# Patient Record
Sex: Male | Born: 1955
Health system: Southern US, Community
[De-identification: ages and names within clinical notes are randomized; demographics above are authoritative.]

## PROBLEM LIST (undated history)

## (undated) DIAGNOSIS — M545 Low back pain, unspecified: Secondary | ICD-10-CM

## (undated) DIAGNOSIS — R112 Nausea with vomiting, unspecified: Secondary | ICD-10-CM

## (undated) DIAGNOSIS — Z9889 Other specified postprocedural states: Secondary | ICD-10-CM

## (undated) DIAGNOSIS — C801 Malignant (primary) neoplasm, unspecified: Secondary | ICD-10-CM

## (undated) DIAGNOSIS — D494 Neoplasm of unspecified behavior of bladder: Secondary | ICD-10-CM

## (undated) HISTORY — DX: Malignant (primary) neoplasm, unspecified: C80.1

---

## 1999-02-11 HISTORY — PX: MENISCUS REPAIR: SHX5179

## 2000-02-11 DIAGNOSIS — D494 Neoplasm of unspecified behavior of bladder: Secondary | ICD-10-CM

## 2000-02-11 HISTORY — DX: Neoplasm of unspecified behavior of bladder: D49.4

## 2000-02-11 HISTORY — PX: CYSTOSCOPY: SHX5120

## 2000-08-12 ENCOUNTER — Encounter (INDEPENDENT_AMBULATORY_CARE_PROVIDER_SITE_OTHER): Payer: Self-pay | Admitting: Specialist

## 2000-08-12 ENCOUNTER — Ambulatory Visit (HOSPITAL_COMMUNITY): Admission: RE | Admit: 2000-08-12 | Discharge: 2000-08-12 | Payer: Self-pay | Admitting: Urology

## 2007-12-29 ENCOUNTER — Encounter: Payer: Self-pay | Admitting: Gastroenterology

## 2008-01-10 ENCOUNTER — Encounter: Payer: Self-pay | Admitting: Gastroenterology

## 2008-02-02 DIAGNOSIS — C679 Malignant neoplasm of bladder, unspecified: Secondary | ICD-10-CM | POA: Insufficient documentation

## 2008-02-03 ENCOUNTER — Ambulatory Visit: Payer: Self-pay | Admitting: Gastroenterology

## 2008-02-03 DIAGNOSIS — R1310 Dysphagia, unspecified: Secondary | ICD-10-CM | POA: Insufficient documentation

## 2008-02-11 HISTORY — PX: INGUINAL HERNIA REPAIR: SHX194

## 2008-02-25 ENCOUNTER — Encounter: Payer: Self-pay | Admitting: Gastroenterology

## 2008-02-25 ENCOUNTER — Ambulatory Visit: Payer: Self-pay | Admitting: Gastroenterology

## 2008-02-29 ENCOUNTER — Encounter: Payer: Self-pay | Admitting: Gastroenterology

## 2008-05-23 ENCOUNTER — Ambulatory Visit (HOSPITAL_COMMUNITY): Admission: RE | Admit: 2008-05-23 | Discharge: 2008-05-23 | Payer: Self-pay | Admitting: Surgery

## 2008-05-23 ENCOUNTER — Encounter (INDEPENDENT_AMBULATORY_CARE_PROVIDER_SITE_OTHER): Payer: Self-pay | Admitting: Surgery

## 2010-01-13 IMAGING — CR DG CHEST 2V
2 series · 2 of 2 positions shown · non-contrast
Comparison: None

CLINICAL DATA: Preop for left inguinal hernia repair

CHEST - 2 VIEW

[view not recorded (1 of 2)]
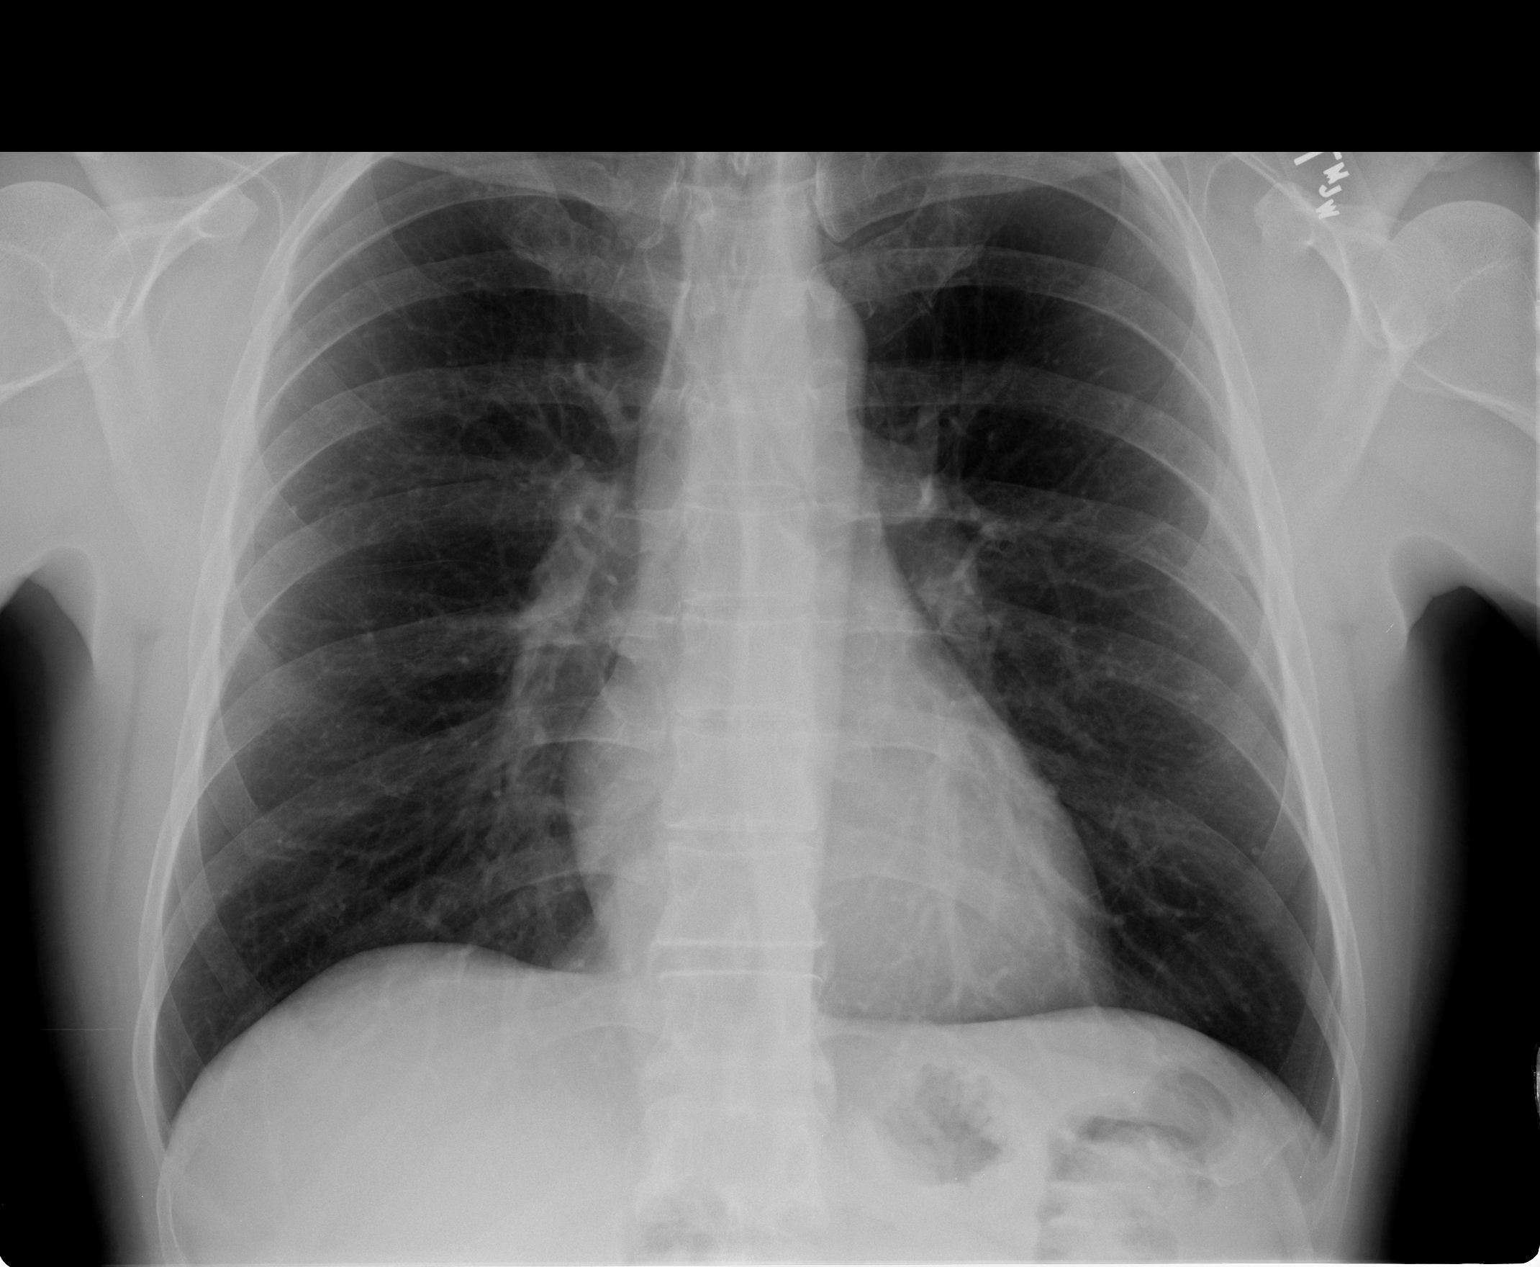

[view not recorded (2 of 2)]
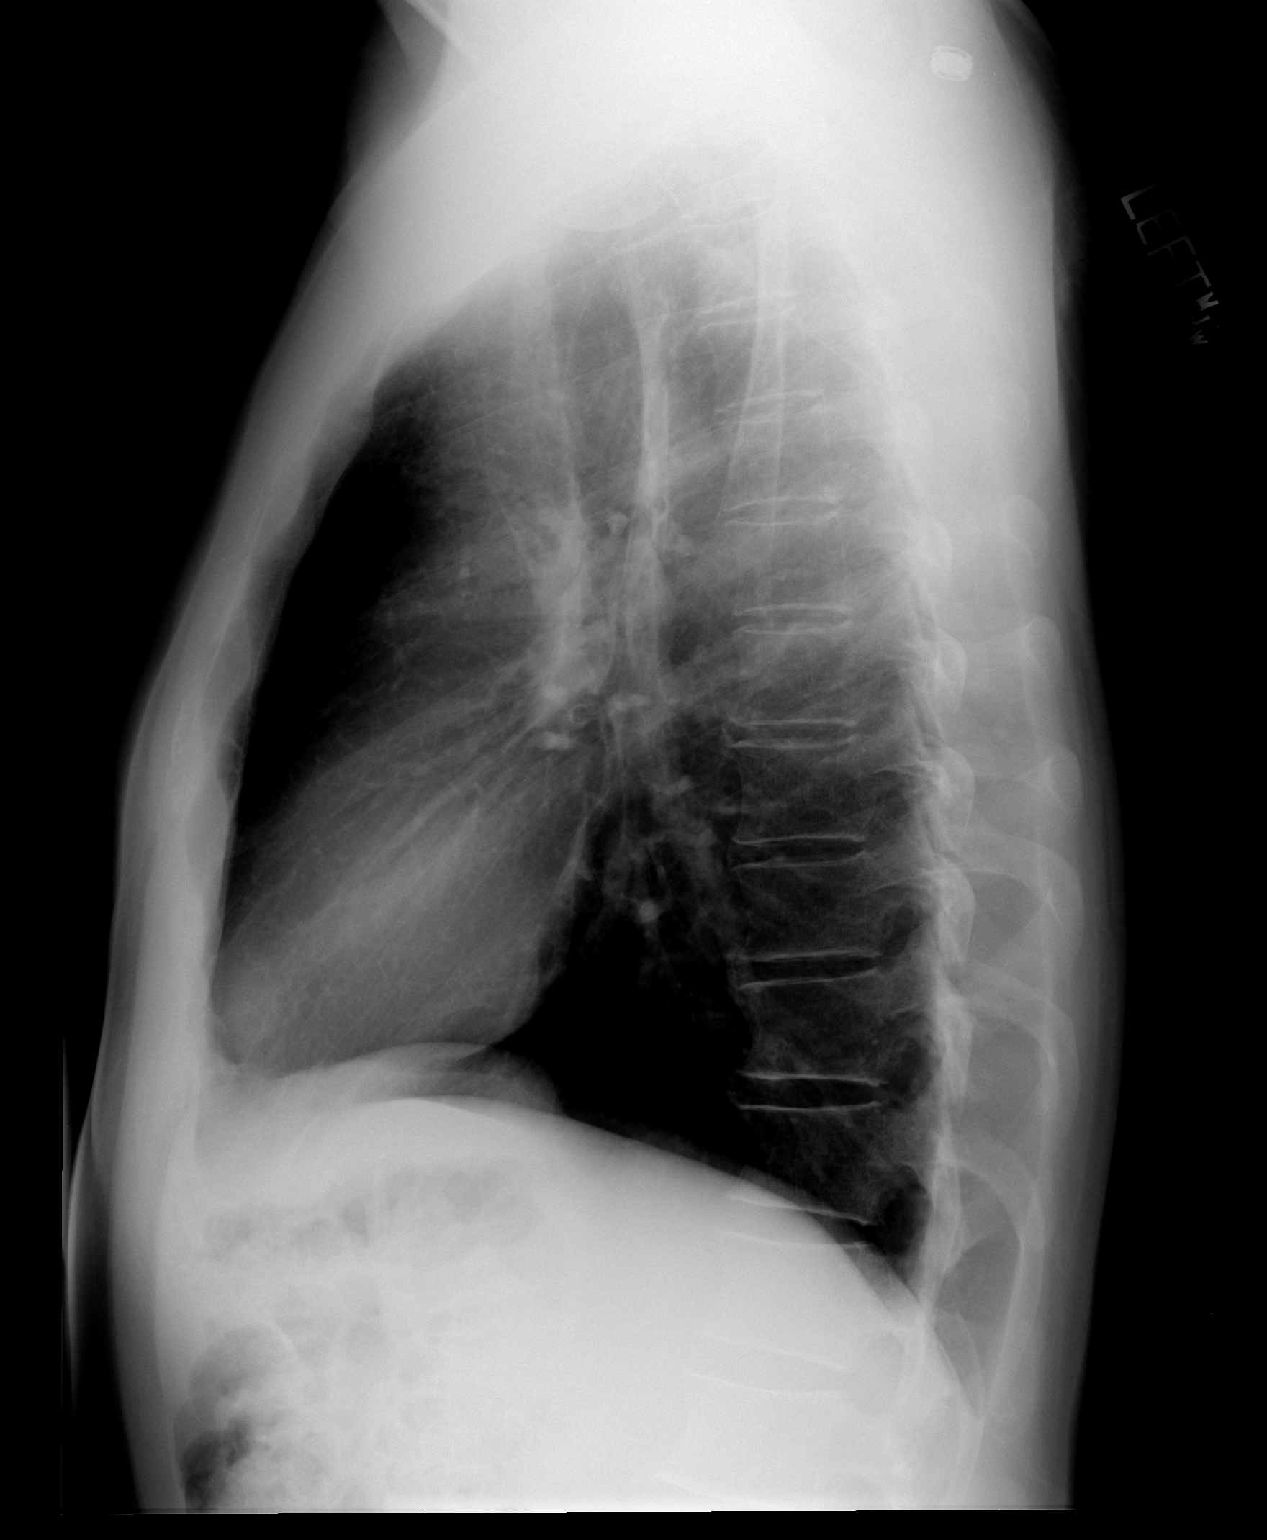

[2 of 2 positions shown; findings below may reference images not displayed]

FINDINGS: The lungs are clear. The heart is within normal limits in
size. No bony abnormality is seen.
IMPRESSION: No active lung disease.

## 2010-05-22 LAB — CBC
HCT: 46.4 % (ref 39.0–52.0)
Hemoglobin: 16.1 g/dL (ref 13.0–17.0)
MCHC: 34.8 g/dL (ref 30.0–36.0)
RBC: 5.15 MIL/uL (ref 4.22–5.81)
RDW: 12.7 % (ref 11.5–15.5)

## 2010-05-22 LAB — BASIC METABOLIC PANEL
CO2: 26 mEq/L (ref 19–32)
Glucose, Bld: 80 mg/dL (ref 70–99)
Potassium: 4.5 mEq/L (ref 3.5–5.1)
Sodium: 138 mEq/L (ref 135–145)

## 2010-05-22 LAB — DIFFERENTIAL
Basophils Absolute: 0 10*3/uL (ref 0.0–0.1)
Basophils Relative: 0 % (ref 0–1)
Eosinophils Relative: 3 % (ref 0–5)
Lymphocytes Relative: 30 % (ref 12–46)
Monocytes Absolute: 0.5 10*3/uL (ref 0.1–1.0)
Monocytes Relative: 9 % (ref 3–12)

## 2010-06-25 NOTE — Op Note (Signed)
NAMEDEQUAVION, FOLLETTE              ACCOUNT NO.:  [id number]   MEDICAL RECORD NO.:  [id number]          PATIENT TYPE:  AMB   LOCATION:  SDS                          FACILITY:  [facility]   PHYSICIAN:  Thomas A. Cornett, M.D.DATE OF BIRTH:  Jun 30, 1955   DATE OF PROCEDURE:  05/23/2008  DATE OF DISCHARGE:                               OPERATIVE REPORT   PREOPERATIVE DIAGNOSES:  1. A 4 cm x 4 cm left chest wall mass.  2. Left inguinal hernia.   DISCHARGE DIAGNOSES:  1. A 4 cm x 4 cm left chest wall mass.  2. Left inguinal hernia.   PROCEDURES PERFORMED:  1. Left inguinal hernia repair.  2. Excision of left chest wall mass subfascial, measuring 4 x 4 cm.   ANESTHESIA:  LMA with 0.25% Sensorcaine local.   ESTIMATED BLOOD LOSS:  20 mL.   DRAINS:  None.   SPECIMEN:  Left chest wall mass 4 x 4 cm, appears to be a lipoma to  Pathology.   INDICATIONS FOR PROCEDURE:  The patient is a 55 year old male with a  left inguinal hernia and a mass just below his left breast.  He wished  to have this resected because it was getting larger and wished to have  his hernia repaired due to pain.  He presents today for the above.   DESCRIPTION OF PROCEDURE:  The patient was placed supine on the OR  table.  After induction of general anesthesia, the left lower chest and  left inguinal regions were prepped in one field under sterile conditions  and draped.  The mass was addressed first at the chest wall.  This area  was marked preoperatively.  Incision was made over it.  Dissection was  carried down.  We had gotten into the fascia, we had opened the fascia  of the pectoralis major muscle when this large fibrofatty tissue was  located. This appeared to be a lipoma.  This was excised in its entirety  and came out quite easily.  We closed the fascia after excision after  ensuring hemostasis with 3-0 Vicryl.  A 4-0 Monocryl was used to close  the skin in subcuticular fashion.   Left inguinal hernia was  addressed next.  An oblique incision was made  in the left inguinal crease.  We used 0.25% Sensorcaine local  anesthesia.  Dissection was carried down through Scarpa fascia until we  identified the aponeurosis of the external oblique.  This was opened in  the direction of its fibers after infiltrating the aponeurosis with  0.25% Sensorcaine local.  Cord structures were identified and encircled  with a 1/4-inch  Penrose drain.  There was a large direct hernia and I  was able to dissect this away from cord.  I then inspected the cord  structures and found no evidence of an indirect hernia or a lipoma.  I  reopened floor of the inguinal canal to better define the defect.  I  placed my finger into preperitoneal space and swept around to create  space for mesh.  I then used UltraPro mesh and constructed an inner  leaflet  and then an onlay piece using a #1 Novafil.  The onlay piece was  placed in the preperitoneal space, I used my finger to spread this out.  I then laid the onlay piece down to the floor of the inguinal canal.  I  secured it down to the pubic tubercle with a 0 Vicryl and I secured the  place where the 2 meshes were combined to the internal oblique with a 0  Vicryl.  We used 0 Novafil to secure the mesh circumferentially to the  shelving edge of inguinal ligament and to the internal oblique and  conjoined tendon medially.  The ilioinguinal nerve was tethered and it  was meshed and I elected to divide this, so this would not be caught in  the mesh and cause postoperative pain, which seemed to do no matter how  I positioned it.  Also, branch of the iliohypogastric had been divided  as well for similar reason because it was tethered to the mesh and I was  concerned for postoperative neuralgic pain secondary to irritation of  the mesh.  We cut a slit for the cord structures and then secured the  mesh around the cord structures with 0 Novafil.  There was ample space  with the tip of my  fifth digit to fit here without any tension.  The  mesh was laid nicely and covered the defect quite nicely.  Irrigation  was used and suctioned out.  I closed the fascia of the external oblique  with a running 2-0 Vicryl.  A 3-0 Vicryl was then used to close Scarpa  fascia and 4-0 Monocryl was used to close the skin in a subcuticular  fashion.  Dermabond was applied to both incisions.  All final counts  were counted and found to be correct of sponge, instruments, and  needles.  The patient was then awoke after Dermabond was applied to his  left chest wall incision in satisfactory condition.       Thomas A. Cornett, M.D.  Electronically Signed     TAC/MEDQ  D:  05/23/2008  T:  05/24/2008  Job:  [id number]   cc:   Jonita Albee, M.D.

## 2010-06-28 NOTE — Op Note (Signed)
[facility]  Patient:    Michael Cooke, Michael Cooke                     MRN: [id number] Proc. Date: 08/12/00 Adm. Date:  [id number] Attending:  Lauree Chandler                           Operative Report  PREOPERATIVE DIAGNOSIS:  Bladder carcinoma.  POSTOPERATIVE DIAGNOSIS:  Bladder carcinoma.  PROCEDURE:  Cystoscopy, bilateral retrograde pyelograms with interpretation, transurethral resection of bladder tumor (3 cm) and left ureteroscopy.  SURGEON:  Dr. Larey Dresser.  ANESTHESIA:  General.  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 On July 30, 2000, renal ultrasound revealed no abnormalities. Cystoscopy revealed a classic papillary tumor on the left trigone right next to the left ureteral orifice. He is brought to the OR today for further evaluation and treatment.  DESCRIPTION OF PROCEDURE:  The patient was brought to the operating room and placed in lithotomy position. The external genitalia were prepped and draped in the usual fashion. He was cystoscoped and the only lesion in the bladder was a 3 cm papillary bladder tumor with a narrow stalk right next to the left ureteral orifice and in fact it was just over the course of the intramural ureter. Bilateral retrograde pyelograms were obtained and there were no evidence of filling defects or obstruction to either upper tract. I did leave an open ended catheter up the left ureter and at this point removed the cystoscope and sounded from 24 to 30 [place] and placed in the 28 [place] resectoscope sheath and with the pure cutting current, resected the stalk at the base and removed the tumor all in one piece. The base of the tumor was then fulgurated, there was no evidence of any residual tumor whatsoever. I then removed the resectoscope and put in a guidewire and removed the open ended ureteral catheter  and inserted the 6 French ultra thin ureteroscope and looked up the very distal ureter and there was no evidence of intraureteral tumor which is fortunate. The guidewire and ureteroscope were removed and the bladder rescoped and there was just some tiny inflammation at the bladder neck from passage of instruments but no significant bleeding. The bladder was emptied, scope removed and the patient sent to the recovery room in good condition. DD:  08/12/00 TD:  08/12/00 Job: [id number]

## 2012-12-24 ENCOUNTER — Encounter: Payer: Self-pay | Admitting: Gastroenterology

## 2013-07-12 ENCOUNTER — Encounter: Payer: Self-pay | Admitting: Gastroenterology

## 2013-08-05 ENCOUNTER — Observation Stay (HOSPITAL_COMMUNITY)
Admission: EM | Admit: 2013-08-05 | Discharge: 2013-08-06 | Disposition: A | Discharge status: Home / Self Care | Payer: BC Managed Care – PPO | Attending: Internal Medicine | Admitting: Internal Medicine

## 2013-08-05 ENCOUNTER — Emergency Department (HOSPITAL_COMMUNITY): Payer: BC Managed Care – PPO

## 2013-08-05 ENCOUNTER — Encounter (HOSPITAL_COMMUNITY): Payer: Self-pay | Admitting: Emergency Medicine

## 2013-08-05 DIAGNOSIS — M545 Low back pain, unspecified: Secondary | ICD-10-CM

## 2013-08-05 DIAGNOSIS — M549 Dorsalgia, unspecified: Secondary | ICD-10-CM | POA: Diagnosis present

## 2013-08-05 DIAGNOSIS — R1310 Dysphagia, unspecified: Secondary | ICD-10-CM

## 2013-08-05 DIAGNOSIS — Z791 Long term (current) use of non-steroidal anti-inflammatories (NSAID): Secondary | ICD-10-CM | POA: Insufficient documentation

## 2013-08-05 DIAGNOSIS — D72829 Elevated white blood cell count, unspecified: Secondary | ICD-10-CM

## 2013-08-05 DIAGNOSIS — M5380 Other specified dorsopathies, site unspecified: Secondary | ICD-10-CM | POA: Insufficient documentation

## 2013-08-05 DIAGNOSIS — Z79899 Other long term (current) drug therapy: Secondary | ICD-10-CM | POA: Insufficient documentation

## 2013-08-05 HISTORY — DX: Neoplasm of unspecified behavior of bladder: D49.4

## 2013-08-05 HISTORY — DX: Low back pain, unspecified: M54.50

## 2013-08-05 HISTORY — DX: Other specified postprocedural states: R11.2

## 2013-08-05 HISTORY — DX: Nausea with vomiting, unspecified: Z98.890

## 2013-08-05 HISTORY — DX: Low back pain: M54.5

## 2013-08-05 LAB — CBC WITH DIFFERENTIAL/PLATELET
Basophils Absolute: 0 10*3/uL (ref 0.0–0.1)
Basophils Relative: 0 % (ref 0–1)
Eosinophils Absolute: 0 10*3/uL (ref 0.0–0.7)
Eosinophils Relative: 0 % (ref 0–5)
HCT: 45.9 % (ref 39.0–52.0)
HEMOGLOBIN: 16.7 g/dL (ref 13.0–17.0)
LYMPHS ABS: 1.2 10*3/uL (ref 0.7–4.0)
Lymphocytes Relative: 8 % — ABNORMAL LOW (ref 12–46)
MCH: 31.4 pg (ref 26.0–34.0)
MCHC: 36.4 g/dL — ABNORMAL HIGH (ref 30.0–36.0)
MCV: 86.3 fL (ref 78.0–100.0)
MONOS PCT: 5 % (ref 3–12)
Monocytes Absolute: 0.7 10*3/uL (ref 0.1–1.0)
NEUTROS ABS: 13 10*3/uL — AB (ref 1.7–7.7)
NEUTROS PCT: 87 % — AB (ref 43–77)
Platelets: 177 10*3/uL (ref 150–400)
RBC: 5.32 MIL/uL (ref 4.22–5.81)
RDW: 12.4 % (ref 11.5–15.5)
WBC: 15 10*3/uL — ABNORMAL HIGH (ref 4.0–10.5)

## 2013-08-05 LAB — I-STAT CHEM 8, ED
BUN: 15 mg/dL (ref 6–23)
Calcium, Ion: 1.21 mmol/L (ref 1.12–1.23)
Chloride: 102 meq/L (ref 96–112)
Creatinine, Ser: 0.8 mg/dL (ref 0.50–1.35)
Glucose, Bld: 109 mg/dL — ABNORMAL HIGH (ref 70–99)
HCT: 50 % (ref 39.0–52.0)
Hemoglobin: 17 g/dL (ref 13.0–17.0)
Potassium: 3.6 meq/L — ABNORMAL LOW (ref 3.7–5.3)
Sodium: 144 meq/L (ref 137–147)
TCO2: 24 mmol/L (ref 0–100)

## 2013-08-05 MED ORDER — BIOTENE DRY MOUTH MT LIQD
15.0000 mL | Freq: Two times a day (BID) | OROMUCOSAL | Status: DC
  Administered 2013-08-06: 15 mL via OROMUCOSAL

## 2013-08-05 MED ORDER — SODIUM CHLORIDE 0.9 % IV SOLN
250.0000 mL | INTRAVENOUS | Status: DC | PRN
Start: 2013-08-05 — End: 2013-08-06

## 2013-08-05 MED ORDER — MORPHINE SULFATE 4 MG/ML IJ SOLN
4.0000 mg | Freq: Once | INTRAMUSCULAR | Status: AC
  Administered 2013-08-05: 4 mg via INTRAMUSCULAR
  Filled 2013-08-05: qty 1

## 2013-08-05 MED ORDER — KETOROLAC TROMETHAMINE 30 MG/ML IJ SOLN
30.0000 mg | Freq: Once | INTRAMUSCULAR | Status: AC
  Administered 2013-08-05: 30 mg via INTRAVENOUS
  Filled 2013-08-05: qty 1

## 2013-08-05 MED ORDER — CHLORHEXIDINE GLUCONATE 0.12 % MT SOLN
15.0000 mL | Freq: Two times a day (BID) | OROMUCOSAL | Status: DC
  Administered 2013-08-05 – 2013-08-06 (×2): 15 mL via OROMUCOSAL
  Filled 2013-08-05 (×4): qty 15

## 2013-08-05 MED ORDER — OXYCODONE HCL 5 MG PO TABS
5.0000 mg | ORAL_TABLET | ORAL | Status: DC | PRN
  Administered 2013-08-05 – 2013-08-06 (×2): 5 mg via ORAL
  Filled 2013-08-05 (×2): qty 1

## 2013-08-05 MED ORDER — SODIUM CHLORIDE 0.9 % IJ SOLN
3.0000 mL | Freq: Two times a day (BID) | INTRAMUSCULAR | Status: DC
  Administered 2013-08-05 – 2013-08-06 (×2): 3 mL via INTRAVENOUS

## 2013-08-05 MED ORDER — METOCLOPRAMIDE HCL 5 MG/ML IJ SOLN
10.0000 mg | Freq: Once | INTRAMUSCULAR | Status: AC
  Administered 2013-08-05: 10 mg via INTRAVENOUS
  Filled 2013-08-05: qty 2

## 2013-08-05 MED ORDER — SODIUM CHLORIDE 0.9 % IJ SOLN
3.0000 mL | INTRAMUSCULAR | Status: DC | PRN
Start: 2013-08-05 — End: 2013-08-06

## 2013-08-05 MED ORDER — ONDANSETRON HCL 4 MG/2ML IJ SOLN
4.0000 mg | Freq: Once | INTRAMUSCULAR | Status: AC
  Administered 2013-08-05: 4 mg via INTRAVENOUS
  Filled 2013-08-05: qty 2

## 2013-08-05 MED ORDER — ENOXAPARIN SODIUM 40 MG/0.4ML ~~LOC~~ SOLN
40.0000 mg | Freq: Every day | SUBCUTANEOUS | Status: DC
  Administered 2013-08-05: 40 mg via SUBCUTANEOUS
  Filled 2013-08-05 (×2): qty 0.4

## 2013-08-05 MED ORDER — METHOCARBAMOL 500 MG PO TABS
750.0000 mg | ORAL_TABLET | Freq: Three times a day (TID) | ORAL | Status: DC | PRN
  Administered 2013-08-05 – 2013-08-06 (×2): 750 mg via ORAL
  Filled 2013-08-05 (×2): qty 2

## 2013-08-05 MED ORDER — ACETAMINOPHEN 325 MG PO TABS: 650.0000 mg | ORAL_TABLET | Freq: Four times a day (QID) | ORAL | Status: DC | PRN

## 2013-08-05 MED ORDER — METHOCARBAMOL 500 MG PO TABS: 1000.0000 mg | ORAL_TABLET | Freq: Four times a day (QID) | ORAL | Status: DC | PRN

## 2013-08-05 MED ORDER — SODIUM CHLORIDE 0.9 % IV SOLN: 250.0000 mL | INTRAVENOUS | Status: DC | PRN

## 2013-08-05 MED ORDER — ONDANSETRON HCL 4 MG PO TABS
4.0000 mg | ORAL_TABLET | Freq: Four times a day (QID) | ORAL | Status: DC | PRN
  Administered 2013-08-06: 4 mg via ORAL
  Filled 2013-08-05: qty 1

## 2013-08-05 MED ORDER — SODIUM CHLORIDE 0.9 % IJ SOLN: 3.0000 mL | Freq: Two times a day (BID) | INTRAMUSCULAR | Status: DC

## 2013-08-05 MED ORDER — NAPROXEN 250 MG PO TABS: 250.0000 mg | ORAL_TABLET | Freq: Two times a day (BID) | ORAL | Status: DC

## 2013-08-05 MED ORDER — HYDROMORPHONE HCL PF 1 MG/ML IJ SOLN: 0.5000 mg | INTRAMUSCULAR | Status: DC | PRN

## 2013-08-05 MED ORDER — DIAZEPAM 5 MG PO TABS
5.0000 mg | ORAL_TABLET | Freq: Once | ORAL | Status: AC
  Administered 2013-08-05: 5 mg via ORAL
  Filled 2013-08-05: qty 1

## 2013-08-05 MED ORDER — ACETAMINOPHEN 650 MG RE SUPP: 650.0000 mg | Freq: Four times a day (QID) | RECTAL | Status: DC | PRN

## 2013-08-05 MED ORDER — ALUM & MAG HYDROXIDE-SIMETH 200-200-20 MG/5ML PO SUSP: 30.0000 mL | Freq: Four times a day (QID) | ORAL | Status: DC | PRN

## 2013-08-05 MED ORDER — OXYCODONE-ACETAMINOPHEN 5-325 MG PO TABS: ORAL_TABLET | ORAL | Status: DC

## 2013-08-05 MED ORDER — SODIUM CHLORIDE 0.9 % IJ SOLN: 3.0000 mL | INTRAMUSCULAR | Status: DC | PRN

## 2013-08-05 MED ORDER — ONDANSETRON HCL 4 MG/2ML IJ SOLN: 4.0000 mg | Freq: Four times a day (QID) | INTRAMUSCULAR | Status: DC | PRN

## 2013-08-05 MED ORDER — HYDROMORPHONE HCL PF 1 MG/ML IJ SOLN: 1.0000 mg | INTRAMUSCULAR | Status: AC | PRN

## 2013-08-05 MED ORDER — HYDROMORPHONE HCL PF 1 MG/ML IJ SOLN
1.0000 mg | Freq: Once | INTRAMUSCULAR | Status: AC
  Administered 2013-08-05: 1 mg via INTRAMUSCULAR
  Filled 2013-08-05: qty 1

## 2013-08-05 MED ORDER — ONDANSETRON HCL 4 MG/2ML IJ SOLN: 4.0000 mg | Freq: Three times a day (TID) | INTRAMUSCULAR | Status: AC | PRN

## 2013-08-05 NOTE — ED Notes (Signed)
Initial Contact - pt laying flat on stretcher, family at bedside.  Pt reports hx back spasms, usually one episode per year, last episode x1 week ago.  Pt reports unable to get self off floor this AM with spasm.  Pt denies pain at this time, reports symp have moderately resolved since arrival to ED.  Pt able to move extremities independently, however does have pain with movement.  Pt denies n/t to extremities, denies bowel/bladder changes or complaints.  Skin PWD.  NAD.

## 2013-08-05 NOTE — ED Notes (Signed)
Per EMS-was bending over to open file cabinet when he injured back-states he is having a "spasm"-took 3 ibuprofen, 2 tylenol, and 1 flexeril at 1025

## 2013-08-05 NOTE — ED Notes (Signed)
Attempted to d/c pt. Again. Pt actively vomiting. MD made aware and will come to see pt.

## 2013-08-05 NOTE — ED Notes (Signed)
Pt to radiology.

## 2013-08-05 NOTE — ED Provider Notes (Addendum)
Michael Cooke Patient could not get up to walk for discharge. After treatment in the emergency department. He continues to complain of back pain and has begun to vomit since treatment here. Toradol ordered. At 19 10 PM patient is able to stand with assistance however he again began to vomit upon standing and complains of spasming in his lower back, nonradiating.. Patient is in too much discomfort to go home. Spoke with Dr. Arnoldo Morale. Patient to be placed on medical surgical floor, 23 hour observation, intractable back pain Results for orders placed during the hospital encounter of 08/05/13  CBC WITH DIFFERENTIAL      Result Value Ref Range   WBC 15.0 (*) 4.0 - 10.5 K/uL   RBC 5.32  4.22 - 5.81 MIL/uL   Hemoglobin 16.7  13.0 - 17.0 g/dL   HCT 45.9  39.0 - 52.0 %   MCV 86.3  78.0 - 100.0 fL   MCH 31.4  26.0 - 34.0 pg   MCHC 36.4 (*) 30.0 - 36.0 g/dL   RDW 12.4  11.5 - 15.5 %   Platelets 177  150 - 400 K/uL   Neutrophils Relative % 87 (*) 43 - 77 %   Neutro Abs 13.0 (*) 1.7 - 7.7 K/uL   Lymphocytes Relative 8 (*) 12 - 46 %   Lymphs Abs 1.2  0.7 - 4.0 K/uL   Monocytes Relative 5  3 - 12 %   Monocytes Absolute 0.7  0.1 - 1.0 K/uL   Eosinophils Relative 0  0 - 5 %   Eosinophils Absolute 0.0  0.0 - 0.7 K/uL   Basophils Relative 0  0 - 1 %   Basophils Absolute 0.0  0.0 - 0.1 K/uL  I-STAT CHEM 8, ED      Result Value Ref Range   Sodium 144  137 - 147 mEq/L   Potassium 3.6 (*) 3.7 - 5.3 mEq/L   Chloride 102  96 - 112 mEq/L   BUN 15  6 - 23 mg/dL   Creatinine, Ser 0.80  0.50 - 1.35 mg/dL   Glucose, Bld 109 (*) 70 - 99 mg/dL   Calcium, Ion 1.21  1.12 - 1.23 mmol/L   TCO2 24  0 - 100 mmol/L   Hemoglobin 17.0  13.0 - 17.0 g/dL   HCT 50.0  39.0 - 52.0 %   Dg Lumbar Spine Complete  08/05/2013   CLINICAL DATA:  Back pain  EXAM: LUMBAR SPINE - COMPLETE 4+ VIEW  COMPARISON:  None.  FINDINGS: Five lumbar type vertebral bodies show minimal curvature in the frontal projection and normal alignment in the lateral  projection. Disc heights are normal except for mild narrowing at L1-2 with small anterior osteophytes. No evidence of pars defect. There is minimal facet degeneration at L4-5 and L5-S1.  IMPRESSION: Minimal curvature. Minimal lower lumbar facet degeneration. No advanced finding.   Electronically Signed   By: Nelson Chimes M.D.   On: 08/05/2013 14:03    Orlie Dakin, MD 08/05/13 2101  Orlie Dakin, MD 08/05/13 2104

## 2013-08-05 NOTE — ED Notes (Signed)
Rn went to d/c pt, Pt sts he is unable to move or sit up to leave. MD made aware. 1mg  of dilaudid given.

## 2013-08-05 NOTE — ED Notes (Signed)
Report given to RN, no bed in room at this time. Floor RN will call when bed is placed.

## 2013-08-05 NOTE — ED Provider Notes (Signed)
CSN: [id number]     Arrival date & time 08/05/13  1219 History   First MD Initiated Contact with Patient 08/05/13 1255     Chief Complaint  Patient presents with  . Back Injury      HPI Pt was seen at 1310.  Per pt, c/o gradual onset and persistence of constant acute flair of his chronic low back "pain" for the past 1 to 2 weeks, worse over the past 2 days.  Denies any change in his usual chronic pain pattern.  Pain worsens with palpation of the area and body position changes. Pt states he was "leaning over a filing cabinet" when he developed a "spasm" in his right lower back. Pt states he "swam over a mile in the pool" this morning that "made me feel better" but "then I got worse again." Pt's wife states pt "might have done too much too soon." Pt states he took tylenol, motrin, and flexeril at approximately 1000 PTA.   Denies incont/retention of bowel or bladder, no saddle anesthesia, no focal motor weakness, no tingling/numbness in extremities, no fevers, no direct injury, no abd pain.   The symptoms have been associated with no other complaints.     Past Medical History  Diagnosis Date  . Bladder tumor 2002    removed by cystoscopy  . Low back pain    Past Surgical History  Procedure Laterality Date  . Cystoscopy  2002    for bladder tumor  . Inguinal hernia repair  2010    History  Substance Use Topics  . Smoking status: Never Smoker   . Smokeless tobacco: Not on file  . Alcohol Use: No    Review of Systems ROS: Statement: All systems negative except as marked or noted in the HPI; Constitutional: Negative for fever and chills. ; ; Eyes: Negative for eye pain, redness and discharge. ; ; ENMT: Negative for ear pain, hoarseness, nasal congestion, sinus pressure and sore throat. ; ; Cardiovascular: Negative for chest pain, palpitations, diaphoresis, dyspnea and peripheral edema. ; ; Respiratory: Negative for cough, wheezing and stridor. ; ; Gastrointestinal: Negative for nausea,  vomiting, diarrhea, abdominal pain, blood in stool, hematemesis, jaundice and rectal bleeding. . ; ; Genitourinary: Negative for dysuria, flank pain and hematuria. ; ; Musculoskeletal: +LBP. Negative for neck pain. Negative for swelling and trauma.; ; Skin: Negative for pruritus, rash, abrasions, blisters, bruising and skin lesion.; ; Neuro: Negative for headache, lightheadedness and neck stiffness. Negative for weakness, altered level of consciousness , altered mental status, extremity weakness, paresthesias, involuntary movement, seizure and syncope.      Allergies  Lactose intolerance (gi)  Home Medications   Prior to Admission medications   Medication Sig Start Date End Date Taking? Authorizing Provider  acetaminophen (TYLENOL) 500 MG tablet Take 500 mg by mouth every 6 (six) hours as needed.   Yes Historical Provider, MD  cyclobenzaprine (FLEXERIL) 10 MG tablet Take 10 mg by mouth once.   Yes Historical Provider, MD  ibuprofen (ADVIL,MOTRIN) 200 MG tablet Take 600 mg by mouth every 6 (six) hours as needed for moderate pain.   Yes Historical Provider, MD  methocarbamol (ROBAXIN) 500 MG tablet Take 2 tablets (1,000 mg total) by mouth 4 (four) times daily as needed for muscle spasms (muscle spasm/pain). 08/05/13   Alfonzo Feller, DO  naproxen (NAPROSYN) 250 MG tablet Take 1 tablet (250 mg total) by mouth 2 (two) times daily with a meal. 08/05/13   Alfonzo Feller, DO  oxyCODONE-acetaminophen (  PERCOCET/ROXICET) 5-325 MG per tablet 1 or 2 tabs PO q6h prn pain 08/05/13   Alfonzo Feller, DO   BP 128/81  Pulse 56  Temp(Src) 97.8 F (36.6 C) (Oral)  Resp 18  SpO2 100% Physical Exam 1315: Physical examination:  Nursing notes reviewed; Vital signs and O2 SAT reviewed;  Constitutional: Well developed, Well nourished, Well hydrated, In no acute distress; Head:  Normocephalic, atraumatic; Eyes: EOMI, PERRL, No scleral icterus; ENMT: Mouth and pharynx normal, Mucous membranes moist; Neck:  Supple, Full range of motion, No lymphadenopathy; Cardiovascular: Regular rate and rhythm, No gallop; Respiratory: Breath sounds clear & equal bilaterally, No rales, rhonchi, wheezes.  Speaking full sentences with ease, Normal respiratory effort/excursion; Chest: Nontender, Movement normal; Abdomen: Soft, Nontender, Nondistended, Normal bowel sounds; Genitourinary: No CVA tenderness; Spine:  No midline CS, TS, LS tenderness. +mild TTP right lumbar paraspinal muscles.;; Extremities: Pulses normal, No tenderness, No edema, No calf edema or asymmetry.; Neuro: AA&Ox3, Major CN grossly intact.  Speech clear. Strength 5/5 equal bilat UE's and LE's, including great toe dorsiflexion.  DTR 2/4 equal bilat UE's and LE's.  No gross sensory deficits.  Neg straight leg raises bilat.; Skin: Color normal, Warm, Dry.   ED Course  Procedures     MDM  MDM Reviewed: previous chart, nursing note and vitals Interpretation: x-ray    Dg Lumbar Spine Complete 08/05/2013   CLINICAL DATA:  Back pain  EXAM: LUMBAR SPINE - COMPLETE 4+ VIEW  COMPARISON:  None.  FINDINGS: Five lumbar type vertebral bodies show minimal curvature in the frontal projection and normal alignment in the lateral projection. Disc heights are normal except for mild narrowing at L1-2 with small anterior osteophytes. No evidence of pars defect. There is minimal facet degeneration at L4-5 and L5-S1.  IMPRESSION: Minimal curvature. Minimal lower lumbar facet degeneration. No advanced finding.   Electronically Signed   By: Nelson Chimes M.D.   On: 08/05/2013 14:03    1525:  "Back spasms" improved after meds. States he "feels the spasm now just when I move a certain way." Will remedicate. Pt wants to go home now. Dx and testing d/w pt and family.  Questions answered.  Verb understanding, agreeable to d/c home with outpt f/u.     Alfonzo Feller, DO 08/06/13 2144

## 2013-08-05 NOTE — ED Notes (Signed)
Bed: WA16 Expected date:  Expected time:  Means of arrival:  Comments: EMS-back pain 

## 2013-08-05 NOTE — ED Notes (Signed)
RN attempted to d/c patient again, pt was standing but began to feel nauseous. Pt given ginger ale and crackers. MD made aware.

## 2013-08-05 NOTE — H&P (Addendum)
[facility] History and Physical  URBAN NAVAL TIR:[id number] DOB: November 29, 1955 DOA: 08/05/2013  Referring physician: EDP PCP: No primary provider on file.  Specialists:   Chief Complaint: Severe Low Back Pain  HPI: Michael Cooke is a 58 y.o. male previously healthy until today when he began to have severe lower right side back pain and muscle spasms.  He reports that his pain began with pain on the left side then he began to have pain on the right side of his lower back.  He describes having 10/10 pain and has not been able to sit up or walk.   He denies any recent trauma and denies any over exertion, and he reports that once a year he has an episode like this with his back for many years.   He denies any numbness or paresthesias or loss of bowel or bladder function.     Review of Systems:  Constitutional: No Weight Loss, No Weight Gain, Night Sweats, Fevers, Chills, Fatigue, or Generalized Weakness HEENT: No Headaches, Difficulty Swallowing,Tooth/Dental Problems,Sore Throat,  No Sneezing, Rhinitis, Ear Ache, Nasal Congestion, or Post Nasal Drip,  Cardio-vascular:  No Chest pain, Orthopnea, PND, Edema in lower extremities, Anasarca, Dizziness, Palpitations  Resp: No Dyspnea, No DOE, No Productive Cough, No Non-Productive Cough, No Hemoptysis, No Change in Color of Mucus,  No Wheezing.    GI: No Heartburn, Indigestion, Abdominal Pain, Nausea, Vomiting, Diarrhea, Change in Bowel Habits,  Loss of Appetite  GU: No Dysuria, Change in Color of Urine, No Urgency or Frequency.  No flank pain.  Musculoskeletal: No Joint Pain or Swelling.  +Decreased Range of Motion in Back. +Back Pain.  Neurologic: No Syncope, No Seizures, Muscle Weakness, Paresthesia, Vision Disturbance or Loss, No Diplopia, No Vertigo, +Difficulty Walking,  Skin: No Rash or Lesions. Psych: No Change in Mood or Affect. No Depression or Anxiety. No Memory loss. No Confusion or Hallucinations   Past Medical History   Diagnosis Date  . Bladder tumor 2002    removed by cystoscopy  . Low back pain   . PONV (postoperative nausea and vomiting)     Past Surgical History  Procedure Laterality Date  . Cystoscopy  2002    for bladder tumor  . Inguinal hernia repair  2010     Prior to Admission medications   Medication Sig Start Date End Date Taking? Authorizing Provider  acetaminophen (TYLENOL) 500 MG tablet Take 500 mg by mouth every 6 (six) hours as needed.   Yes Historical Provider, MD  cyclobenzaprine (FLEXERIL) 10 MG tablet Take 10 mg by mouth once.   Yes Historical Provider, MD  ibuprofen (ADVIL,MOTRIN) 200 MG tablet Take 600 mg by mouth every 6 (six) hours as needed for moderate pain.   Yes Historical Provider, MD  methocarbamol (ROBAXIN) 500 MG tablet Take 2 tablets (1,000 mg total) by mouth 4 (four) times daily as needed for muscle spasms (muscle spasm/pain). 08/05/13   Alfonzo Feller, DO  naproxen (NAPROSYN) 250 MG tablet Take 1 tablet (250 mg total) by mouth 2 (two) times daily with a meal. 08/05/13   Alfonzo Feller, DO  oxyCODONE-acetaminophen (PERCOCET/ROXICET) 5-325 MG per tablet 1 or 2 tabs PO q6h prn pain 08/05/13   Alfonzo Feller, DO     Allergies  Allergen Reactions  . Lactose Intolerance (Gi)     unknown    Social History:  reports that he has never smoked. He has never used smokeless tobacco. He reports that he does not drink  alcohol or use illicit drugs.     Family History  Problem Relation Age of Onset  . Lymphoma Mother     Non Hodgkins Lymphoma      Physical Exam:  GEN:  Pleasant Well Nourished and Well developed 58 y.o. Caucasian male examined and in no acute distress; cooperative with exam Filed Vitals:   08/05/13 1748 08/05/13 1932 08/05/13 2113 08/05/13 2220  BP: 136/81 107/71 129/80 138/71  Pulse: 64 52 61 69  Temp:  97.6 F (36.4 C) 97.4 F (36.3 C) 97.8 F (36.6 C)  TempSrc:  Oral Oral Oral  Resp: 17 16 14 16   Height:    5\' 10"  (1.778 m)   Weight:    77.973 kg (171 lb 14.4 oz)  SpO2: 100% 96% 99% 99%   Blood pressure 138/71, pulse 69, temperature 97.8 F (36.6 C), temperature source Oral, resp. rate 16, height 5\' 10"  (1.778 m), weight 77.973 kg (171 lb 14.4 oz), SpO2 99.00%. PSYCH: He is alert and oriented x4; does not appear anxious does not appear depressed; affect is normal HEENT: Normocephalic and Atraumatic, Mucous membranes pink; PERRLA; EOM intact; Fundi:  Benign;  No scleral icterus, Nares: Patent, Oropharynx: Clear, Fair Dentition, Neck:  FROM, no cervical lymphadenopathy nor thyromegaly or carotid bruit; no JVD; Breasts:: Not examined CHEST WALL: No tenderness CHEST: Normal respiration, clear to auscultation bilaterally HEART: Regular rate and rhythm; no murmurs rubs or gallops BACK: No kyphosis or scoliosis; no CVA tenderness ABDOMEN: Positive Bowel Sounds,  soft non-tender; no masses, no organomegaly Rectal Exam: Not done EXTREMITIES: No cyanosis, clubbing or edema; no ulcerations. Genitalia: not examined PULSES: 2+ and symmetric SKIN: Normal hydration no rash or ulceration CNS:  Alert and Oriented X 4, No Focal Deficits Vascular: pulses palpable throughout    Labs on Admission:  Basic Metabolic Panel:  Recent Labs Lab 08/05/13 1934  NA 144  K 3.6*  CL 102  GLUCOSE 109*  BUN 15  CREATININE 0.80   Liver Function Tests: No results found for this basename: AST, ALT, ALKPHOS, BILITOT, PROT, ALBUMIN,  in the last 168 hours No results found for this basename: LIPASE, AMYLASE,  in the last 168 hours No results found for this basename: AMMONIA,  in the last 168 hours CBC:  Recent Labs Lab 08/05/13 1934 08/05/13 2022  WBC  --  15.0*  NEUTROABS  --  13.0*  HGB 17.0 16.7  HCT 50.0 45.9  MCV  --  86.3  PLT  --  177   Cardiac Enzymes: No results found for this basename: CKTOTAL, CKMB, CKMBINDEX, TROPONINI,  in the last 168 hours  BNP (last 3 results) No results found for this basename: PROBNP,   in the last 8760 hours CBG: No results found for this basename: GLUCAP,  in the last 168 hours  Radiological Exams on Admission: Dg Lumbar Spine Complete  08/05/2013   CLINICAL DATA:  Back pain  EXAM: LUMBAR SPINE - COMPLETE 4+ VIEW  COMPARISON:  None.  FINDINGS: Five lumbar type vertebral bodies show minimal curvature in the frontal projection and normal alignment in the lateral projection. Disc heights are normal except for mild narrowing at L1-2 with small anterior osteophytes. No evidence of pars defect. There is minimal facet degeneration at L4-5 and L5-S1.  IMPRESSION: Minimal curvature. Minimal lower lumbar facet degeneration. No advanced finding.   Electronically Signed   By: Nelson Chimes M.D.   On: 08/05/2013 14:03      Assessment/Plan:   58 y.o. male  with  Principal Problem:   Intractable back pain Active Problems:   Right-sided low back pain without sciatica   Leukocytosis  1.  Intractable Back Pain-  Lumbago with Muscle Spasms,   Pain Control with IV Dilaudid PRN, and Robaxin 750 mg PO q 8 hrs PRN Muscle spasms.  May need a Physical therapy eval.    2. Leukocytosis-  WBC = 15,  Probable Stress Rxn, Monitor Trend.     3.  DVT prophylaxis with Lovenox.       Code Status:     FULL CODE  Family Communication:    Wife at Bedside Disposition Plan:      Observation Status   Time spent: [facility] Pager [phone]  If 7PM-7AM, please contact night-coverage [email or web address] Password [facility] 08/05/2013, 11:17 PM

## 2013-08-06 ENCOUNTER — Observation Stay (HOSPITAL_COMMUNITY): Payer: BC Managed Care – PPO

## 2013-08-06 DIAGNOSIS — M545 Low back pain, unspecified: Principal | ICD-10-CM

## 2013-08-06 DIAGNOSIS — R1310 Dysphagia, unspecified: Secondary | ICD-10-CM

## 2013-08-06 LAB — BASIC METABOLIC PANEL
BUN: 16 mg/dL (ref 6–23)
CO2: 24 mEq/L (ref 19–32)
CREATININE: 0.83 mg/dL (ref 0.50–1.35)
Calcium: 8.7 mg/dL (ref 8.4–10.5)
Chloride: 102 mEq/L (ref 96–112)
GFR calc Af Amer: >90 mL/min (ref >90)
GFR calc non Af Amer: >90 mL/min (ref >90)
GLUCOSE: 108 mg/dL — AB (ref 70–99)
Potassium: 3.7 mEq/L (ref 3.7–5.3)
Sodium: 139 mEq/L (ref 137–147)

## 2013-08-06 LAB — CBC
HEMATOCRIT: 44.3 % (ref 39.0–52.0)
Hemoglobin: 15.1 g/dL (ref 13.0–17.0)
MCH: 29.7 pg (ref 26.0–34.0)
MCHC: 34.1 g/dL (ref 30.0–36.0)
MCV: 87 fL (ref 78.0–100.0)
Platelets: 172 10*3/uL (ref 150–400)
RBC: 5.09 MIL/uL (ref 4.22–5.81)
RDW: 12.5 % (ref 11.5–15.5)
WBC: 7.2 10*3/uL (ref 4.0–10.5)

## 2013-08-06 MED ORDER — GADOBENATE DIMEGLUMINE 529 MG/ML IV SOLN
16.0000 mL | Freq: Once | INTRAVENOUS | Status: AC | PRN
  Administered 2013-08-06: 16 mL via INTRAVENOUS

## 2013-08-06 MED ORDER — NAPROXEN 250 MG PO TABS: 250.0000 mg | ORAL_TABLET | Freq: Two times a day (BID) | ORAL | Status: DC

## 2013-08-06 MED ORDER — OXYCODONE HCL 5 MG PO TABS
5.0000 mg | ORAL_TABLET | ORAL | Status: DC | PRN
  Administered 2013-08-06 (×2): 5 mg via ORAL
  Filled 2013-08-06 (×2): qty 1

## 2013-08-06 MED ORDER — METHOCARBAMOL 500 MG PO TABS: 1000.0000 mg | ORAL_TABLET | Freq: Four times a day (QID) | ORAL | Status: DC | PRN

## 2013-08-06 MED ORDER — OXYCODONE-ACETAMINOPHEN 7.5-325 MG PO TABS: 1.0000 | ORAL_TABLET | ORAL | Status: DC | PRN

## 2013-08-06 NOTE — Discharge Summary (Signed)
Physician Discharge Summary  Michael Cooke YKZ:[id number] DOB: Jun 09, 1955 DOA: 08/05/2013  PCP: No primary provider on file.  Admit date: 08/05/2013 Discharge date: 08/06/2013  Recommendations for Outpatient Follow-up:  1. Pt will need to follow up with PCP in 2-3 weeks post discharge 2. Please obtain BMP to evaluate electrolytes and kidney function 3. Please also check CBC to evaluate Hg and Hct levels  Discharge Diagnoses:  Principal Problem:   Intractable back pain Active Problems:   Right-sided low back pain without sciatica    Discharge Condition: Stable  Diet recommendation: Heart healthy diet discussed in details   History of present illness:  58 y.o. male previously healthy until earlier in the day PTA, when he began to have severe lower right side back pain and muscle spasms. He reports that his pain began with pain on the left side then he began to have pain on the right side of his lower back. He describes having 10/10 pain and has not been able to sit up or walk. He denies any recent trauma and denies any over exertion, and he reports that once a year he has an episode like this with his back for many years. He denies any numbness or paresthesias or loss of bowel or bladder function.   Hospital Course:  Principal Problem:   Intractable back pain - better this AM - MRI with no specific acute abnormalities - continue analgesia and provide lumbar corset  - PT as pt able to tolerate  Active Problems:   Right-sided low back pain without sciatica - analgesia as needed   Procedures/Studies: Dg Lumbar Spine Complete  08/05/2013   CLINICAL DATA:  Back pain  EXAM: LUMBAR SPINE - COMPLETE 4+ VIEW  COMPARISON:  None.  FINDINGS: Five lumbar type vertebral bodies show minimal curvature in the frontal projection and normal alignment in the lateral projection. Disc heights are normal except for mild narrowing at L1-2 with small anterior osteophytes. No evidence of pars defect.  There is minimal facet degeneration at L4-5 and L5-S1.  IMPRESSION: Minimal curvature. Minimal lower lumbar facet degeneration. No advanced finding.   Electronically Signed   By: Nelson Chimes M.D.   On: 08/05/2013 14:03   Mr Lumbar Spine W Wo Contrast  08/06/2013   CLINICAL DATA:  58 year old male with sudden onset low back pain and difficulty weight-bearing. Pain radiating to the right hip. Initial encounter.  EXAM: MRI LUMBAR SPINE WITHOUT AND WITH CONTRAST  TECHNIQUE: Multiplanar and multiecho pulse sequences of the lumbar spine were obtained without and with intravenous contrast.  CONTRAST:  96mL MULTIHANCE GADOBENATE DIMEGLUMINE 529 MG/ML IV SOLN  COMPARISON:  Lumbar radiographs 08/05/2013.  FINDINGS: Normal lumbar segmentation depicted on comparison. Vertebral height and alignment within normal limits. No marrow edema or evidence of acute osseous abnormality.  Visualized lower thoracic spinal cord is normal with conus medularis at T12-L1. Normal cauda equina nerve roots. No abnormal enhancement identified.  Visualized abdominal viscera and paraspinal soft tissues are within normal limits.  T11-T12:  Partially visible, negative.  T12-L1: Small left paracentral disc protrusion (series 7, image 7). No associated stenosis.  L1-L2: Central annular fissure of the disc (series 5, image 3). Anterior disc osteophyte complex. No stenosis.  L2-L3: Circumferential disc bulge. Broad-based central to left paracentral annular fissure (series 5, image 11). Mild facet hypertrophy. No spinal or lateral recess stenosis. No foraminal stenosis.  L3-L4: Negative disc. Moderate facet hypertrophy greater on the left. No stenosis.  L4-L5: Left annular fissure of the disc  at the left neural foramen in proximity to the exiting left L4 nerve (series 5, image 24). Mild associated disc bulging. No spinal or lateral recess stenosis.  L5-S1:  Negative.  IMPRESSION: 1. No spinal stenosis or right side neural impingement identified. 2. Mild  disc degeneration at L4-L5 with left foraminal annular fissure in proximity to the exiting left L4 nerve. Mild L2-L3 disc degeneration with broad-based annular fissure. Small left paracentral disc herniation at T12-L1.   Electronically Signed   By: Lars Pinks M.D.   On: 08/06/2013 10:49    Consultations:  None  Antibiotics:  None  Discharge Exam: Filed Vitals:   08/06/13 0541  BP: 115/71  Pulse: 60  Temp: 98.5 F (36.9 C)  Resp: 16   Filed Vitals:   08/05/13 1932 08/05/13 2113 08/05/13 2220 08/06/13 0541  BP: 107/71 129/80 138/71 115/71  Pulse: 52 61 69 60  Temp: 97.6 F (36.4 C) 97.4 F (36.3 C) 97.8 F (36.6 C) 98.5 F (36.9 C)  TempSrc: Oral Oral Oral Oral  Resp: 16 14 16 16   Height:   5\' 10"  (1.778 m)   Weight:   77.973 kg (171 lb 14.4 oz)   SpO2: 96% 99% 99% 100%    General: Pt is alert, follows commands appropriately, not in acute distress Cardiovascular: Regular rate and rhythm, S1/S2 +, no murmurs, no rubs, no gallops Respiratory: Clear to auscultation bilaterally, no wheezing, no crackles, no rhonchi Abdominal: Soft, non tender, non distended, bowel sounds +, no guarding Extremities: no edema, no cyanosis, pulses palpable bilaterally DP and PT Neuro: Grossly nonfocal  Discharge Instructions  Discharge Instructions   Diet - low sodium heart healthy    Complete by:  As directed      Increase activity slowly    Complete by:  As directed             Medication List    STOP taking these medications       cyclobenzaprine 10 MG tablet  Commonly known as:  FLEXERIL     ibuprofen 200 MG tablet  Commonly known as:  ADVIL,MOTRIN      TAKE these medications       acetaminophen 500 MG tablet  Commonly known as:  TYLENOL  Take 500 mg by mouth every 6 (six) hours as needed.     methocarbamol 500 MG tablet  Commonly known as:  ROBAXIN  Take 2 tablets (1,000 mg total) by mouth 4 (four) times daily as needed for muscle spasms (muscle spasm/pain).      naproxen 250 MG tablet  Commonly known as:  NAPROSYN  Take 1 tablet (250 mg total) by mouth 2 (two) times daily with a meal.     oxyCODONE-acetaminophen 7.5-325 MG per tablet  Commonly known as:  PERCOCET  Take 1 tablet by mouth every 4 (four) hours as needed for pain.           Follow-up Information   Follow up with Your regular medical doctor. Schedule an appointment as soon as possible for a visit in 3 days.      Follow up with Faye Ramsay, MD. (As needed, call my cell phone [phone])    Specialty:  Internal Medicine   Contact information:   [address] [place] [address] [phone]        The results of significant diagnostics from this hospitalization (including imaging, microbiology, ancillary and laboratory) are listed below for reference.     Microbiology: No results found for  this or any previous visit (from the past 240 hour(s)).   Labs: Basic Metabolic Panel:  Recent Labs Lab 08/05/13 1934 08/06/13 0540  NA 144 139  K 3.6* 3.7  CL 102 102  CO2  --  24  GLUCOSE 109* 108*  BUN 15 16  CREATININE 0.80 0.83  CALCIUM  --  8.7   CBC:  Recent Labs Lab 08/05/13 1934 08/05/13 2022 08/06/13 0540  WBC  --  15.0* 7.2  NEUTROABS  --  13.0*  --   HGB 17.0 16.7 15.1  HCT 50.0 45.9 44.3  MCV  --  86.3 87.0  PLT  --  177 172    SIGNED: Time coordinating discharge: Over 30 minutes  Faye Ramsay, MD  [facility] 08/06/2013, 11:16 AM Pager [phone]  If 7PM-7AM, please contact night-coverage [email or web address] Password [email or web address]

## 2013-08-06 NOTE — Evaluation (Signed)
Physical Therapy Evaluation Patient Details Name: Michael Cooke MRN: [id number] DOB: 06-04-1955 Today's Date: 08/06/2013   History of Present Illness  58 yo male admitted with intractable back pain across lower back.   Clinical Impression  On eval, pt was Min guard -supervision level assist for mobility-able to ambulate ~300 feet with use of walker. Gait was very slow, guarded. Pt reported lumbar instability. Improved stability/comfort with use of walker. Discussed safe sitting postures, mobility techniques, importance of mobilizing as tolerated. Discussed use of lumbar corset for comfort temporarily-requested order from MD if she was in agreement. Encouraged pt to make outpatient ortho consultation if pain/mobility does not improve within next 7-10 days. Pt currently awaiting transport to MRI.    Follow Up Recommendations No PT follow up;Supervision - Intermittent    Equipment Recommendations   (lumbar corset if MD agrees. Pt states he may borrow walker from family if needed)    Recommendations for Other Services       Precautions / Restrictions Precautions Precautions: Fall;Back Precaution Comments: back precautions for safety, comfort Restrictions Weight Bearing Restrictions: No      Mobility  Bed Mobility Overal bed mobility: Needs Assistance Bed Mobility: Rolling;Sidelying to Sit;Sit to Sidelying Rolling: Min guard Sidelying to sit: Min guard     Sit to sidelying: Min guard General bed mobility comments: VCs for logroll technique. increased time.   Transfers Overall transfer level: Needs assistance Equipment used: None;Rolling walker (2 wheeled) Transfers: Sit to/from Stand           General transfer comment: VCs for safety. Pt has to use bil UE/hands to "walk up/down" thighs. Increased time.   Ambulation/Gait Ambulation/Gait assistance: Min guard Ambulation Distance (Feet): 300 Feet Assistive device: Rolling walker (2 wheeled);None       General Gait  Details: Began ambulation without walker-very guarded, slow. Pt stated spine felt "unstable". Transitioned to walker-improved stability with use of UEs for support.   Stairs            Wheelchair Mobility    Modified Rankin (Stroke Patients Only)       Balance                                             Pertinent Vitals/Pain "lumbar instability" per pt. Discomfort across lower back. Unrated. Pain not worsened with mobility per pt.     Home Living Family/patient expects to be discharged to:: Private residence Living Arrangements: Spouse/significant other Available Help at Discharge: Family Type of Home: House Home Access: Level entry     [place]: Bed/bath upstairs;Two level   Additional Comments: may borrow walker from mother n law if needed    Prior Function Level of Independence: Independent               Hand Dominance        Extremity/Trunk Assessment   Upper Extremity Assessment: Overall WFL for tasks assessed           Lower Extremity Assessment: Overall WFL for tasks assessed      Cervical / Trunk Assessment: Normal  Communication   Communication: No difficulties  Cognition Arousal/Alertness: Awake/alert Behavior During Therapy: WFL for tasks assessed/performed Overall Cognitive Status: Within Functional Limits for tasks assessed                      General Comments  Exercises        Assessment/Plan    PT Assessment Patient needs continued PT services  PT Diagnosis Difficulty walking;Abnormality of gait;Acute pain   PT Problem List Decreased activity tolerance;Decreased mobility;Pain;Decreased knowledge of precautions  PT Treatment Interventions Gait training;DME instruction;Stair training;Functional mobility training;Therapeutic activities;Therapeutic exercise;Patient/family education;Balance training   PT Goals (Current goals can be found in the Care Plan section) Acute Rehab PT  Goals Patient Stated Goal: less pain. return to PLOF PT Goal Formulation: With patient/family Time For Goal Achievement: 08/13/13 Potential to Achieve Goals: Good    Frequency Min 3X/week   Barriers to discharge        Co-evaluation               End of Session Equipment Utilized During Treatment: Gait belt Activity Tolerance: Patient limited by pain Patient left: in bed;with call bell/phone within reach;with family/visitor present      Functional Assessment Tool Used: clinical observation Functional Limitation: Mobility: Walking and moving around Mobility: Walking and Moving Around Current Status ([id number]): At least 1 percent but less than 20 percent impaired, limited or restricted Mobility: Walking and Moving Around Goal Status [phone]): At least 1 percent but less than 20 percent impaired, limited or restricted    Time: 0855-0929 PT Time Calculation (min): 34 min   Charges:   PT Evaluation $Initial PT Evaluation Tier I: 1 Procedure PT Treatments $Gait Training: 8-22 mins $Self Care/Home Management: 8-22   PT G Codes:   Functional Assessment Tool Used: clinical observation Functional Limitation: Mobility: Walking and moving around    [employer], MPT Pager: [phone]

## 2013-08-06 NOTE — Progress Notes (Signed)
UR Completed.  Cooke, Michael Jane [phone] 08/06/2013  

## 2013-08-06 NOTE — Discharge Instructions (Signed)
Herniated Disk The bones of your spinal column (vertebrae) protect your spinal cord and nerves that go into your arms and legs. The vertebrae are separated by disks that cushion the spinal column and put space between your vertebrae. This allows movement between the vertebrae, which allows you to bend, rotate, and move your body from side to side. Sometimes, the disks move out of place (herniate) or break open (rupture) from injury or strain. The most common area for a disk herniation is in the lower back (lumbar area). Sometimes herniation occurs in the neck (cervical) disks.  CAUSES  As we grow older, the strong, fibrous cords that connect the vertebrae and support and surround the disks (ligaments) start to weaken. A strain on the back may cause a break in the disk ligaments. RISK FACTORS Herniated disks occur most often in men who are aged 56 years to 35 years, usually after strenuous activity. Other risk factors include conditions present at birth (congenital) that affect the size of the lumbar spinal canal. Additionally, a narrowing of the areas where the nerves exit the spinal canal can occur as you age. SYMPTOMS  Symptoms of a herniated disk vary. You may have weakness in certain muscles. This weakness can include difficulty lifting your leg or arm, difficulty standing on your toes on one side, or difficulty squeezing tightly with one of your hands. You may have numbness. You may feel a mild tingling, dull ache, or a burning or pulsating pain. In some cases, the pain is severe enough that you are unable to move. The pain most often occurs on one side of the body. The pain often starts slowly. It may get worse:  After you sit or stand.  At night.  When you sneeze, cough, or laugh.  When you bend backwards or walk more than a few yards. The pain, numbness, or weakness will often go away or improve a lot over a period of weeks to months. Herniated lumbar disk Symptoms of a herniated lumbar  disk may include sharp pain in one part of your leg, hip, or buttocks and numbness in other parts. You also may feel pain or numbness on the back of your calf or the top or sole of your foot. The same leg also may feel weak. Herniated cervical disk Symptoms of a herniated cervical disk may include pain when you move your neck, deep pain near or over your shoulder blade, or pain that moves to your upper arm, forearm, or fingers. DIAGNOSIS  To diagnose a herniated disk, your caregiver will perform a physical exam. Your caregiver also may perform diagnostic tests to see your disk or to test the reaction of your muscles and the function of your nerves. During the physical exam, your caregiver may ask you to:  Sit, stand, and walk. While you walk, your caregiver may ask you to try walking on your toes and then your heels.  Bend forward, backward, and sideways.  Raise your shoulders, elbow, wrist, and fingers and check your strength during these tasks. Your caregiver will check for:  Numbness or loss of feeling.  Muscle reflexes, which may be slower or missing.  Muscle strength, which may be weaker.  Posture or the way your spine curves. Diagnostic tests that may be done include:  A spinal X-ray exam to rule out other causes of back pain.  Magnetic resonance imaging (MRI) or computed tomography (CT) scan, which will show if the herniated disk is pressing on your spinal canal.  Electromyography.  This is sometimes used to identify the specific area of nerve involvement. TREATMENT  Initial treatment for a herniated disk is a short period of rest with medicines for pain. Pain medicines can include nonsteroidal anti-inflammatory medicines (NSAIDs), muscle relaxants for back spasms, and (rarely) narcotic pain medicine for severe pain that does not respond to NSAID use. Bed rest is often limited to 1 or 2 days at the most because prolonged rest can delay recovery. When the herniation involves the  lower back, sitting should be avoided as much as possible because sitting increases pressure on the ruptured disk. Sometimes a soft neck collar will be prescribed for a few days to weeks to help support your neck in the case of a cervical herniation. Physical therapy is often prescribed for patients with disk disease. Physical therapists will teach you how to properly lift, dress, walk, and perform other activities. They will work on strengthening the muscles that help support your spine. In some cases, physical therapy alone is not enough to treat a herniated disk. Steroid injections along the involved nerve root may be needed to help control pain. The steroid is injected in the area of the herniated disk and helps by reducing swelling around the disk. Sometimes surgery is the best option to treat a herniated disk.  SEEK IMMEDIATE MEDICAL CARE IF:   You have numbness, tingling, weakness, or problems with the use of your arms or legs.  You have severe headaches that are not relieved with the use of medicines.  You notice a change in your bowel or bladder control.  You have increasing pain in any areas of your body.  You experience shortness of breath, dizziness, or fainting. MAKE SURE YOU:   Understand these instructions.  Will watch your condition.  Will get help right away if you are not doing well or get worse. Document Released: 01/25/2000 Document Revised: 04/21/2011 Document Reviewed: 12/31/2012 [facility] Patient Information 2015 [place], [place]. This information is not intended to replace advice given to you by your health care provider. Make sure you discuss any questions you have with your health care provider.

## 2013-11-04 ENCOUNTER — Encounter: Payer: Self-pay | Admitting: Gastroenterology

## 2016-05-19 DIAGNOSIS — C678 Malignant neoplasm of overlapping sites of bladder: Secondary | ICD-10-CM | POA: Diagnosis not present

## 2016-05-19 DIAGNOSIS — N400 Enlarged prostate without lower urinary tract symptoms: Secondary | ICD-10-CM | POA: Diagnosis not present

## 2016-06-11 DIAGNOSIS — Z23 Encounter for immunization: Secondary | ICD-10-CM | POA: Diagnosis not present

## 2017-07-28 DIAGNOSIS — Z1389 Encounter for screening for other disorder: Secondary | ICD-10-CM | POA: Diagnosis not present

## 2017-07-28 DIAGNOSIS — Z0000 Encounter for general adult medical examination without abnormal findings: Secondary | ICD-10-CM | POA: Diagnosis not present

## 2017-07-28 DIAGNOSIS — D126 Benign neoplasm of colon, unspecified: Secondary | ICD-10-CM | POA: Diagnosis not present

## 2017-07-28 DIAGNOSIS — Z8551 Personal history of malignant neoplasm of bladder: Secondary | ICD-10-CM | POA: Diagnosis not present

## 2017-08-05 DIAGNOSIS — N400 Enlarged prostate without lower urinary tract symptoms: Secondary | ICD-10-CM | POA: Diagnosis not present

## 2017-08-05 DIAGNOSIS — C678 Malignant neoplasm of overlapping sites of bladder: Secondary | ICD-10-CM | POA: Diagnosis not present

## 2017-08-24 ENCOUNTER — Encounter: Payer: Self-pay | Admitting: Gastroenterology

## 2017-09-14 ENCOUNTER — Telehealth: Payer: Self-pay

## 2017-09-14 ENCOUNTER — Ambulatory Visit (AMBULATORY_SURGERY_CENTER): Payer: Self-pay

## 2017-09-14 VITALS — Ht 70.0 in | Wt 176.6 lb

## 2017-09-14 DIAGNOSIS — Z86010 Personal history of colonic polyps: Secondary | ICD-10-CM

## 2017-09-14 NOTE — Telephone Encounter (Signed)
Dr. Lyndel Safe,   Michael Cooke was scheduled for a colonoscopy 10/02/17 and he had his PV with me today. During his PV patient states that he is experiencing symptoms of dysphagia and would like to have a double procedure. Patient had a previous endo/ colon with Dr. Sharlett Iles on 02/25/2008 and according to the patient he was dilated. Would you like for the patient to have an office visit or would you like for me to directly schedule [name] for a double procedure/ Please advise and I will contact the patient and get him rescheduled ASAP. Thanks!    Riki Sheer, LPN ( PV )

## 2017-09-14 NOTE — Progress Notes (Signed)
Denies allergies to eggs or soy products. Denies complication of anesthesia or sedation. Denies use of weight loss medication. Denies use of O2.   Emmi instructions declined.    Patient states during PV that he would like to have an endoscopy at the same time that he has a colonoscopy. Patient states ten years ago he had an endoscopy with dilation due to dysphagia. Patient is experiencing the same symptoms . The colonoscopy that is scheduled for 10/02/17 will be cancelled a not will be sent to Dr. Lyndel Safe and both procedures will be rescheduled. Patient will be contacted once I hear  from Dr. Lyndel Safe.   Riki Sheer, LPN

## 2017-09-22 NOTE — Telephone Encounter (Signed)
Michael Cooke, I do not mind doing both at the same time without the office visit But, I do not know what the protocol is Can you run it by [facility] as well

## 2017-09-22 NOTE — Telephone Encounter (Signed)
Dr. Lyndel Safe,   I was told that our protocol for a double procedure is as followed. The patient will need an office visit prior to the procedure in order for insurance to pay for the endoscopy there has to be a documented reason for the endoscopy unlike a colonoscopy. We do colonoscopies routinely for screening, but not endoscopies. I will call the patient and get get him scheduled for an office visit and then he can be scheduled for a double procedure. The patient was sure that he wanted to have both procedures at the same time. Thanks for your response!   Riki Sheer, LPN ( PV )

## 2017-09-22 NOTE — Telephone Encounter (Signed)
Called pt and left message to call our office to schedule an OV with Dr Lyndel Safe in [place]. Michael Cooke

## 2017-10-02 ENCOUNTER — Encounter: Payer: Self-pay | Admitting: Gastroenterology

## 2017-10-15 ENCOUNTER — Encounter: Payer: Self-pay | Admitting: Physician Assistant

## 2017-10-15 ENCOUNTER — Ambulatory Visit: Payer: BLUE CROSS/BLUE SHIELD | Admitting: Physician Assistant

## 2017-10-15 VITALS — BP 108/64 | HR 62 | Ht 70.0 in | Wt 175.0 lb

## 2017-10-15 DIAGNOSIS — Z86010 Personal history of colonic polyps: Secondary | ICD-10-CM

## 2017-10-15 DIAGNOSIS — R1310 Dysphagia, unspecified: Secondary | ICD-10-CM | POA: Diagnosis not present

## 2017-10-15 DIAGNOSIS — K222 Esophageal obstruction: Secondary | ICD-10-CM

## 2017-10-15 MED ORDER — NA SULFATE-K SULFATE-MG SULF 17.5-3.13-1.6 GM/177ML PO SOLN: ORAL | 0 refills | Status: DC

## 2017-10-15 NOTE — Patient Instructions (Signed)
You have been scheduled for a colonoscopy. Please follow written instructions given to you at your visit today.  Please pick up your prep supplies at the pharmacy within the next 1-3 days. If you use inhalers (even only as needed), please bring them with you on the day of your procedure.   

## 2017-10-15 NOTE — Progress Notes (Signed)
Subjective:    Patient ID: Michael Cooke, male    DOB: 1956/02/06, 62 y.o.   MRN: [id number]  HPI Michael Cooke is a pleasant 62 year old white male, referred today by Dr. Osborne Cooke for discussion regarding recall colonoscopy and EGD. Patient is generally in good health, he does have history of bladder cancer.  Is not currently on any prescription medications. He is known previously to Dr. Sharlett Cooke and had undergone colonoscopy in January 2010 with finding of one pedunculated polyp which was removed.  This was  a tubular adenoma and he was indicated for 5-year interval follow-up. He also had EGD in 2010 with finding of a Schatzki's ring of the distal esophagus which was Michael Cooke dilated to 18 mm.  Patient feels that the dilation definitely helped his symptoms for a long time. Has no current lower GI complaints, specifically no complaints of abdominal discomfort or changes in bowel habits melena or hematochezia. He says that he has developed recurrent solid food dysphasia over the past year or so.  He does not have daily symptoms but rather intermittent symptoms that occur once every couple of weeks.  He typically will have difficulty with meat and rice.  Occasionally he will have an episode requiring regurgitation, most of the time if he is able to stop eating and weight to the food will eventually pass.  He has no complaints of heartburn or indigestion.  Review of Systems Pertinent positive and negative review of systems were noted in the above HPI section.  All other review of systems was otherwise negative.  Outpatient Encounter Medications as of 10/15/2017  Medication Sig  . acetaminophen (TYLENOL) 500 MG tablet Take 500 mg by mouth every 6 (six) hours as needed.  Marland Kitchen ibuprofen (ADVIL,MOTRIN) 200 MG tablet Take 200 mg by mouth as needed.  . Na Sulfate-K Sulfate-Mg Sulf 17.5-3.13-1.6 GM/177ML SOLN Take as directed for colonoscopy prep.   No facility-administered encounter medications on file as of  10/15/2017.    Allergies  Allergen Reactions  . Lactose Intolerance (Gi)     unknown   Patient Active Problem List   Diagnosis Date Noted  . Low back pain 08/05/2013  . Intractable back pain 08/05/2013  . Right-sided low back pain without sciatica 08/05/2013  . DYSPHAGIA UNSPECIFIED 02/03/2008  . BLADDER CANCER 02/02/2008   Social History   Socioeconomic History  . Marital status: Married    Spouse name: Not on file  . Number of children: Not on file  . Years of education: Not on file  . Highest education level: Not on file  Occupational History  . Not on file  Social Needs  . Financial resource strain: Not on file  . Food insecurity:    Worry: Not on file    Inability: Not on file  . Transportation needs:    Medical: Not on file    Non-medical: Not on file  Tobacco Use  . Smoking status: Never Smoker  . Smokeless tobacco: Never Used  Substance and Sexual Activity  . Alcohol use: Yes    Comment: occasional wine  . Drug use: No  . Sexual activity: Not on file  Lifestyle  . Physical activity:    Days per week: Not on file    Minutes per session: Not on file  . Stress: Not on file  Relationships  . Social connections:    Talks on phone: Not on file    Gets together: Not on file    Attends religious service: Not on file  Active member of club or organization: Not on file    Attends meetings of clubs or organizations: Not on file    Relationship status: Not on file  . Intimate partner violence:    Fear of current or ex partner: Not on file    Emotionally abused: Not on file    Physically abused: Not on file    Forced sexual activity: Not on file  Other Topics Concern  . Not on file  Social History Narrative  . Not on file    Michael Cooke family history includes Lymphoma in his mother.      Objective:    Vitals:   10/15/17 1326  BP: 108/64  Pulse: 62    Physical Exam; well-developed white male in no acute distress, pleasant blood pressure 108/64  pulse 62, height 5 foot 10, weight 175, BMI 25.1.  HEENT; nontraumatic normocephalic EOMI PERRLA sclera anicteric oral mucosa moist, Cardiovascular; regular rate and rhythm with S1-S2 no murmur rub or gallop, Pulmonary; clear bilaterally, Abdomen; soft nontender nondistended bowel sounds are active no palpable mass or hepatosplenomegaly, Rectal ;exam not done, Ext; no clubbing cyanosis or edema skin warm dry, neuro psych; alert and oriented, grossly nonfocal mood and affect appropriate       Assessment & Plan:   #18 62 year old white male with history of adenomatous colon polyp at colonoscopy 2010, overdue for follow-up colonoscopy.  Currently asymptomatic. #2 recurrent intermittent solid food dysphasia, and patient with history of distal esophageal Schatzki's ring status post dilation 2010   #3 history of bladder cancer  Plan; Patient will be scheduled for EGD with probable esophageal dilation and colonoscopy with Dr. Loletha Cooke.  Both procedures were discussed in detail with the patient including indications risks and benefits and he is agreeable to proceed.     Michael Cooke Michael Harold PA-C 10/15/2017   Cc: Michael Cooke, Michael Him, MD

## 2017-10-15 NOTE — Progress Notes (Signed)
Thank you for sending this case to me. I have reviewed the entire note, and the outlined plan seems appropriate.  Appears on my schedule for endoscopic availability.  Wilfrid Lund, MD

## 2017-10-16 ENCOUNTER — Encounter: Payer: Self-pay | Admitting: Gastroenterology

## 2017-10-23 ENCOUNTER — Ambulatory Visit (AMBULATORY_SURGERY_CENTER): Payer: BLUE CROSS/BLUE SHIELD | Admitting: Gastroenterology

## 2017-10-23 ENCOUNTER — Encounter: Payer: Self-pay | Admitting: Gastroenterology

## 2017-10-23 VITALS — BP 105/69 | HR 55 | Temp 98.7°F | Resp 18 | Ht 70.0 in | Wt 175.0 lb

## 2017-10-23 DIAGNOSIS — R1310 Dysphagia, unspecified: Secondary | ICD-10-CM | POA: Diagnosis not present

## 2017-10-23 DIAGNOSIS — K222 Esophageal obstruction: Secondary | ICD-10-CM

## 2017-10-23 DIAGNOSIS — Z86010 Personal history of colonic polyps: Secondary | ICD-10-CM | POA: Diagnosis not present

## 2017-10-23 DIAGNOSIS — D122 Benign neoplasm of ascending colon: Secondary | ICD-10-CM | POA: Diagnosis not present

## 2017-10-23 DIAGNOSIS — D123 Benign neoplasm of transverse colon: Secondary | ICD-10-CM | POA: Diagnosis not present

## 2017-10-23 DIAGNOSIS — R1319 Other dysphagia: Secondary | ICD-10-CM

## 2017-10-23 MED ORDER — SODIUM CHLORIDE 0.9 % IV SOLN: 500.0000 mL | Freq: Once | INTRAVENOUS | Status: AC

## 2017-10-23 NOTE — Progress Notes (Signed)
Report given to PACU, vss 

## 2017-10-23 NOTE — Op Note (Signed)
[facility] Patient Name: Michael Cooke Procedure Date: 10/23/2017 10:36 AM MRN: [id number] Endoscopist: Mallie Mussel L. Loletha Carrow , MD Age: 62 Referring MD:  Date of Birth: 1955-02-18 Gender: Male Account #: [id number] Procedure:                Colonoscopy Indications:              Surveillance: Personal history of adenomatous                            polyps on last colonoscopy > 5 years ago (2010) Medicines:                Monitored Anesthesia Care Procedure:                Pre-Anesthesia Assessment:                           - Prior to the procedure, a History and Physical                            was performed, and patient medications and                            allergies were reviewed. The patient's tolerance of                            previous anesthesia was also reviewed. The risks                            and benefits of the procedure and the sedation                            options and risks were discussed with the patient.                            All questions were answered, and informed consent                            was obtained. Prior Anticoagulants: The patient has                            taken no previous anticoagulant or antiplatelet                            agents. ASA Grade Assessment: I - A normal, healthy                            patient. After reviewing the risks and benefits,                            the patient was deemed in satisfactory condition to                            undergo the procedure.  After obtaining informed consent, the colonoscope                            was passed under direct vision. Throughout the                            procedure, the patient's blood pressure, pulse, and                            oxygen saturations were monitored continuously. The                            Colonoscope was introduced through the anus and                            advanced to the the terminal  ileum, with                            identification of the appendiceal orifice and IC                            valve. The colonoscopy was performed without                            difficulty. The patient tolerated the procedure                            well. The quality of the bowel preparation was                            good. The terminal ileum, ileocecal valve,                            appendiceal orifice, and rectum were photographed.                            The quality of the bowel preparation was evaluated                            using the BBPS [facility] Bowel Preparation Scale)                            with scores of: Right Colon = 2, Transverse Colon =                            2 and Left Colon = 2. The total BBPS score equals                            6.,after lavage. The bowel preparation used was                            SUPREP. Scope In: 10:59:44 AM Scope Out: 11:23:23 AM Scope Withdrawal Time: 0 hours 19 minutes 42 seconds  Total Procedure Duration: 0 hours 23 minutes 39 seconds  Findings:                 The perianal and digital rectal examinations were                            normal.                           The terminal ileum appeared normal.                           A 2 mm polyp was found in the proximal ascending                            colon. The polyp was sessile. The polyp was removed                            with a cold biopsy forceps. Resection and retrieval                            were complete.                           A 8 mm polyp was found in the transverse colon. The                            polyp was flat with a mucus cap. The polyp was                            removed with a hot snare. Resection and retrieval                            were complete.                           Multiple diverticula were found in the left colon.                           The exam was otherwise without abnormality on                             direct and retroflexion views. Complications:            No immediate complications. Estimated Blood Loss:     Estimated blood loss was minimal. Impression:               - The examined portion of the ileum was normal.                           - One 2 mm polyp in the proximal ascending colon,                            removed with a cold biopsy forceps. Resected and  retrieved.                           - One 8 mm polyp in the transverse colon, removed                            with a hot snare. Resected and retrieved.                           - Diverticulosis in the left colon.                           - The examination was otherwise normal on direct                            and retroflexion views. Recommendation:           - Patient has a contact number available for                            emergencies. The signs and symptoms of potential                            delayed complications were discussed with the                            patient. Return to normal activities tomorrow.                            Written discharge instructions were provided to the                            patient.                           - Resume previous diet.                           - Continue present medications.                           - Await pathology results.                           - Repeat colonoscopy is recommended for                            surveillance. The colonoscopy date will be                            determined after pathology results from today's                            exam become available for review.  L. Loletha Carrow, MD 10/23/2017 11:32:16 AM This report has been signed electronically.

## 2017-10-23 NOTE — Patient Instructions (Addendum)
YOU HAD AN ENDOSCOPIC PROCEDURE TODAY AT [facility]:   Refer to the procedure report that was given to you for any specific questions about what was found during the examination.  If the procedure report does not answer your questions, please call your gastroenterologist to clarify.  If you requested that your care partner not be given the details of your procedure findings, then the procedure report has been included in a sealed envelope for you to review at your convenience later.  YOU SHOULD EXPECT: Some feelings of bloating in the abdomen. Passage of more gas than usual.  Walking can help get rid of the air that was put into your GI tract during the procedure and reduce the bloating. If you had a lower endoscopy (such as a colonoscopy or flexible sigmoidoscopy) you may notice spotting of blood in your stool or on the toilet paper. If you underwent a bowel prep for your procedure, you may not have a normal bowel movement for a few days.  Please Note:  You might notice some irritation and congestion in your nose or some drainage.  This is from the oxygen used during your procedure.  There is no need for concern and it should clear up in a day or so.  SYMPTOMS TO REPORT IMMEDIATELY:   Following upper endoscopy (EGD)  Vomiting of blood or coffee ground material  New chest pain or pain under the shoulder blades  Painful or persistently difficult swallowing  New shortness of breath  Fever of 100F or higher  Black, tarry-looking stools  For urgent or emergent issues, a gastroenterologist can be reached at any hour by calling [phone].   DIET:  We do recommend a small meal at first, but then you may proceed to your regular diet.  Drink plenty of fluids but you should avoid alcoholic beverages for 24 hours.  MEDICATIONS: Continue present medications.  Please see handouts given to you by your recovery nurse.  ACTIVITY:  You should plan to take it easy for the rest of  today and you should NOT DRIVE or use heavy machinery until tomorrow (because of the sedation medicines used during the test).    FOLLOW UP: Our staff will call the number listed on your records the next business day following your procedure to check on you and address any questions or concerns that you may have regarding the information given to you following your procedure. If we do not reach you, we will leave a message.  However, if you are feeling well and you are not experiencing any problems, there is no need to return our call.  We will assume that you have returned to your regular daily activities without incident.  If any biopsies were taken you will be contacted by phone or by letter within the next 1-3 weeks.  Please call us at [phone] if you have not heard about the biopsies in 3 weeks.   Thank you for allowing [place] to provide for your healthcare needs today.  SIGNATURES/CONFIDENTIALITY: You and/or your care partner have signed paperwork which will be entered into your electronic medical record.  These signatures attest to the fact that that the information above on your After Visit Summary has been reviewed and is understood.  Full responsibility of the confidentiality of this discharge information lies with you and/or your care-partner.YOU HAD AN ENDOSCOPIC PROCEDURE TODAY AT [facility]:   Refer to the procedure report that was given to you for any  specific questions about what was found during the examination.  If the procedure report does not answer your questions, please call your gastroenterologist to clarify.  If you requested that your care partner not be given the details of your procedure findings, then the procedure report has been included in a sealed envelope for you to review at your convenience later.  YOU SHOULD EXPECT: Some feelings of bloating in the abdomen. Passage of more gas than usual.  Walking can help get rid of the air that was put into your  GI tract during the procedure and reduce the bloating. If you had a lower endoscopy (such as a colonoscopy or flexible sigmoidoscopy) you may notice spotting of blood in your stool or on the toilet paper. If you underwent a bowel prep for your procedure, you may not have a normal bowel movement for a few days.  Please Note:  You might notice some irritation and congestion in your nose or some drainage.  This is from the oxygen used during your procedure.  There is no need for concern and it should clear up in a day or so.  SYMPTOMS TO REPORT IMMEDIATELY:   Following lower endoscopy (colonoscopy or flexible sigmoidoscopy):  Excessive amounts of blood in the stool  Significant tenderness or worsening of abdominal pains  Swelling of the abdomen that is new, acute  Fever of 100F or higher   Following upper endoscopy (EGD)  Vomiting of blood or coffee ground material  New chest pain or pain under the shoulder blades  Painful or persistently difficult swallowing  New shortness of breath  Fever of 100F or higher  Black, tarry-looking stools  For urgent or emergent issues, a gastroenterologist can be reached at any hour by calling [phone].   DIET:  We do recommend a small meal at first, but then you may proceed to your regular diet.  Drink plenty of fluids but you should avoid alcoholic beverages for 24 hours.  MEDICATIONS: Continue present medications.  Please see handouts given to you by your recovery nurse.  ACTIVITY:  You should plan to take it easy for the rest of today and you should NOT DRIVE or use heavy machinery until tomorrow (because of the sedation medicines used during the test).    FOLLOW UP: Our staff will call the number listed on your records the next business day following your procedure to check on you and address any questions or concerns that you may have regarding the information given to you following your procedure. If we do not reach you, we will leave a  message.  However, if you are feeling well and you are not experiencing any problems, there is no need to return our call.  We will assume that you have returned to your regular daily activities without incident.  If any biopsies were taken you will be contacted by phone or by letter within the next 1-3 weeks.  Please call us at [phone] if you have not heard about the biopsies in 3 weeks.   Thank you for allowing [place] to provide for your healthcare needs today.  SIGNATURES/CONFIDENTIALITY: You and/or your care partner have signed paperwork which will be entered into your electronic medical record.  These signatures attest to the fact that that the information above on your After Visit Summary has been reviewed and is understood.  Full responsibility of the confidentiality of this discharge information lies with you and/or your care-partner.

## 2017-10-23 NOTE — Progress Notes (Signed)
Pt's states no medical or surgical changes since previsit or office visit. 

## 2017-10-23 NOTE — Op Note (Signed)
[facility] Patient Name: Michael Cooke Procedure Date: 10/23/2017 10:36 AM MRN: [id number] Endoscopist: Mallie Mussel L. Loletha Carrow , MD Age: 62 Referring MD:  Date of Birth: [birth year] Gender: Male Account #: [id number] Procedure:                Upper GI endoscopy Indications:              Dysphagia Medicines:                Monitored Anesthesia Care Procedure:                Pre-Anesthesia Assessment:                           - Prior to the procedure, a History and Physical                            was performed, and patient medications and                            allergies were reviewed. The patient's tolerance of                            previous anesthesia was also reviewed. The risks                            and benefits of the procedure and the sedation                            options and risks were discussed with the patient.                            All questions were answered, and informed consent                            was obtained. Prior Anticoagulants: The patient has                            taken no previous anticoagulant or antiplatelet                            agents. ASA Grade Assessment: I - A normal, healthy                            patient. After reviewing the risks and benefits,                            the patient was deemed in satisfactory condition to                            undergo the procedure.                           After obtaining informed consent, the endoscope was  passed under direct vision. Throughout the                            procedure, the patient's blood pressure, pulse, and                            oxygen saturations were monitored continuously. The                            Endoscope was introduced through the mouth, and                            advanced to the second part of duodenum. The upper                            GI endoscopy was accomplished without difficulty.                       The patient tolerated the procedure well. Scope In: Scope Out: Findings:                 The larynx was normal.                           One benign-appearing, intrinsic mild stenosis was                            found at the gastroesophageal junction. This                            stenosis measured 1.4 cm (inner diameter) x less                            than one cm (in length). The stenosis was                            traversed. A TTS dilator was passed through the                            scope. Dilation with a 16-17-18 mm balloon dilator                            was performed to 18 mm. The dilation site was                            examined and showed mild mucosal disruption and                            mild improvement in luminal narrowing.                           The exam of the esophagus was otherwise normal.                           The stomach was normal.  The cardia and gastric fundus were normal on                            retroflexion.                           The examined duodenum was normal. Complications:            No immediate complications. Estimated Blood Loss:     Estimated blood loss was minimal. Impression:               - Normal larynx.                           - Benign-appearing esophageal stenosis.                           - Normal stomach.                           - Normal examined duodenum.                           - No specimens collected. Recommendation:           - Patient has a contact number available for                            emergencies. The signs and symptoms of potential                            delayed complications were discussed with the                            patient. Return to normal activities tomorrow.                            Written discharge instructions were provided to the                            patient.                           - Resume previous  diet.                           - Continue present medications.                           - See the other procedure note for documentation of                            additional recommendations. Asante Ritacco L. Loletha Carrow, MD 10/23/2017 11:28:07 AM This report has been signed electronically.

## 2017-10-23 NOTE — Progress Notes (Signed)
Called to room to assist during endoscopic procedure.  Patient ID and intended procedure confirmed with present staff. Received instructions for my participation in the procedure from the performing physician.  

## 2017-10-26 ENCOUNTER — Telehealth: Payer: Self-pay | Admitting: *Deleted

## 2017-10-26 NOTE — Telephone Encounter (Signed)
Second attempt follow up call.  Voicemail with name identifier.  Left message to call if any questions or concerns.

## 2017-10-26 NOTE — Telephone Encounter (Signed)
First follow up call attempt.  Voicemail with name identifier, message left to call if any questions or concerns.

## 2017-10-28 ENCOUNTER — Encounter: Payer: Self-pay | Admitting: Gastroenterology

## 2018-08-20 DIAGNOSIS — Z0000 Encounter for general adult medical examination without abnormal findings: Secondary | ICD-10-CM | POA: Diagnosis not present

## 2018-08-20 DIAGNOSIS — Z125 Encounter for screening for malignant neoplasm of prostate: Secondary | ICD-10-CM | POA: Diagnosis not present

## 2018-08-23 DIAGNOSIS — R82998 Other abnormal findings in urine: Secondary | ICD-10-CM | POA: Diagnosis not present

## 2018-08-27 DIAGNOSIS — Z1331 Encounter for screening for depression: Secondary | ICD-10-CM | POA: Diagnosis not present

## 2018-08-27 DIAGNOSIS — Z8551 Personal history of malignant neoplasm of bladder: Secondary | ICD-10-CM | POA: Diagnosis not present

## 2018-08-27 DIAGNOSIS — D126 Benign neoplasm of colon, unspecified: Secondary | ICD-10-CM | POA: Diagnosis not present

## 2018-08-27 DIAGNOSIS — Z0000 Encounter for general adult medical examination without abnormal findings: Secondary | ICD-10-CM | POA: Diagnosis not present

## 2018-09-22 DIAGNOSIS — Z8551 Personal history of malignant neoplasm of bladder: Secondary | ICD-10-CM | POA: Diagnosis not present

## 2018-12-16 DIAGNOSIS — M25561 Pain in right knee: Secondary | ICD-10-CM | POA: Diagnosis not present

## 2018-12-16 DIAGNOSIS — M238X1 Other internal derangements of right knee: Secondary | ICD-10-CM | POA: Diagnosis not present

## 2018-12-28 DIAGNOSIS — M25561 Pain in right knee: Secondary | ICD-10-CM | POA: Diagnosis not present

## 2019-01-03 DIAGNOSIS — S83241A Other tear of medial meniscus, current injury, right knee, initial encounter: Secondary | ICD-10-CM | POA: Diagnosis not present

## 2019-01-03 DIAGNOSIS — M25561 Pain in right knee: Secondary | ICD-10-CM | POA: Diagnosis not present

## 2019-02-14 DIAGNOSIS — M94261 Chondromalacia, right knee: Secondary | ICD-10-CM | POA: Diagnosis not present

## 2019-02-14 DIAGNOSIS — S83231A Complex tear of medial meniscus, current injury, right knee, initial encounter: Secondary | ICD-10-CM | POA: Diagnosis not present

## 2019-02-14 DIAGNOSIS — X58XXXA Exposure to other specified factors, initial encounter: Secondary | ICD-10-CM | POA: Diagnosis not present

## 2019-02-14 DIAGNOSIS — Y999 Unspecified external cause status: Secondary | ICD-10-CM | POA: Diagnosis not present

## 2019-06-07 DIAGNOSIS — H6980 Other specified disorders of Eustachian tube, unspecified ear: Secondary | ICD-10-CM | POA: Diagnosis not present

## 2019-06-22 DIAGNOSIS — M25561 Pain in right knee: Secondary | ICD-10-CM | POA: Diagnosis not present

## 2019-11-22 DIAGNOSIS — Z0000 Encounter for general adult medical examination without abnormal findings: Secondary | ICD-10-CM | POA: Diagnosis not present

## 2019-11-22 DIAGNOSIS — Z125 Encounter for screening for malignant neoplasm of prostate: Secondary | ICD-10-CM | POA: Diagnosis not present

## 2019-12-09 DIAGNOSIS — Z0000 Encounter for general adult medical examination without abnormal findings: Secondary | ICD-10-CM | POA: Diagnosis not present

## 2019-12-09 DIAGNOSIS — D126 Benign neoplasm of colon, unspecified: Secondary | ICD-10-CM | POA: Diagnosis not present

## 2020-01-18 DIAGNOSIS — Z1212 Encounter for screening for malignant neoplasm of rectum: Secondary | ICD-10-CM | POA: Diagnosis not present

## 2020-10-26 DIAGNOSIS — Z8551 Personal history of malignant neoplasm of bladder: Secondary | ICD-10-CM | POA: Diagnosis not present

## 2020-11-20 ENCOUNTER — Encounter: Payer: Self-pay | Admitting: Gastroenterology

## 2020-12-03 DIAGNOSIS — Z125 Encounter for screening for malignant neoplasm of prostate: Secondary | ICD-10-CM | POA: Diagnosis not present

## 2020-12-03 DIAGNOSIS — Z0000 Encounter for general adult medical examination without abnormal findings: Secondary | ICD-10-CM | POA: Diagnosis not present

## 2020-12-10 DIAGNOSIS — Z0000 Encounter for general adult medical examination without abnormal findings: Secondary | ICD-10-CM | POA: Diagnosis not present

## 2020-12-10 DIAGNOSIS — Z1339 Encounter for screening examination for other mental health and behavioral disorders: Secondary | ICD-10-CM | POA: Diagnosis not present

## 2020-12-10 DIAGNOSIS — Z1212 Encounter for screening for malignant neoplasm of rectum: Secondary | ICD-10-CM | POA: Diagnosis not present

## 2020-12-10 DIAGNOSIS — Z1331 Encounter for screening for depression: Secondary | ICD-10-CM | POA: Diagnosis not present

## 2021-12-12 DIAGNOSIS — R7989 Other specified abnormal findings of blood chemistry: Secondary | ICD-10-CM | POA: Diagnosis not present

## 2021-12-12 DIAGNOSIS — Z125 Encounter for screening for malignant neoplasm of prostate: Secondary | ICD-10-CM | POA: Diagnosis not present

## 2021-12-12 DIAGNOSIS — Z0000 Encounter for general adult medical examination without abnormal findings: Secondary | ICD-10-CM | POA: Diagnosis not present

## 2021-12-16 DIAGNOSIS — Z1212 Encounter for screening for malignant neoplasm of rectum: Secondary | ICD-10-CM | POA: Diagnosis not present

## 2021-12-19 ENCOUNTER — Encounter: Payer: Self-pay | Admitting: Gastroenterology

## 2021-12-19 DIAGNOSIS — Z0000 Encounter for general adult medical examination without abnormal findings: Secondary | ICD-10-CM | POA: Diagnosis not present

## 2021-12-19 DIAGNOSIS — Z1331 Encounter for screening for depression: Secondary | ICD-10-CM | POA: Diagnosis not present

## 2021-12-19 DIAGNOSIS — R82998 Other abnormal findings in urine: Secondary | ICD-10-CM | POA: Diagnosis not present

## 2021-12-19 DIAGNOSIS — Z1339 Encounter for screening examination for other mental health and behavioral disorders: Secondary | ICD-10-CM | POA: Diagnosis not present

## 2022-02-13 ENCOUNTER — Ambulatory Visit (AMBULATORY_SURGERY_CENTER): Payer: BLUE CROSS/BLUE SHIELD

## 2022-02-13 DIAGNOSIS — Z86010 Personal history of colonic polyps: Secondary | ICD-10-CM

## 2022-02-13 MED ORDER — NA SULFATE-K SULFATE-MG SULF 17.5-3.13-1.6 GM/177ML PO SOLN: 1.0000 | Freq: Once | ORAL | 0 refills | Status: AC

## 2022-02-13 NOTE — Progress Notes (Signed)

## 2022-02-20 ENCOUNTER — Encounter: Payer: Self-pay | Admitting: Gastroenterology

## 2022-02-24 ENCOUNTER — Ambulatory Visit (AMBULATORY_SURGERY_CENTER): Payer: BC Managed Care – PPO | Admitting: Gastroenterology

## 2022-02-24 ENCOUNTER — Encounter: Payer: Self-pay | Admitting: Gastroenterology

## 2022-02-24 VITALS — BP 95/56 | HR 71 | Temp 96.9°F | Resp 12 | Ht 70.0 in | Wt 158.0 lb

## 2022-02-24 DIAGNOSIS — Z86010 Personal history of colonic polyps: Secondary | ICD-10-CM | POA: Diagnosis not present

## 2022-02-24 DIAGNOSIS — Z09 Encounter for follow-up examination after completed treatment for conditions other than malignant neoplasm: Secondary | ICD-10-CM | POA: Diagnosis not present

## 2022-02-24 DIAGNOSIS — Z1211 Encounter for screening for malignant neoplasm of colon: Secondary | ICD-10-CM | POA: Diagnosis not present

## 2022-02-24 DIAGNOSIS — D123 Benign neoplasm of transverse colon: Secondary | ICD-10-CM

## 2022-02-24 MED ORDER — SODIUM CHLORIDE 0.9 % IV SOLN: 500.0000 mL | INTRAVENOUS | Status: DC

## 2022-02-24 NOTE — Op Note (Signed)
[facility] Patient Name: Michael Cooke Procedure Date: 02/24/2022 1:18 PM MRN: [id number] Endoscopist: Mallie Mussel L. Loletha Carrow , MD, [id number] Age: 67 Referring MD:  Date of Birth: 08/25/1955 Gender: Male Account #: [id number] Procedure:                Colonoscopy Indications:              Surveillance: Personal history of adenomatous                            polyps on last colonoscopy > 3 years ago                           Tubular adenoma and SSP without dysplasia (2 to 8                            mm) last colonoscopy September 2019 Medicines:                Monitored Anesthesia Care Procedure:                Pre-Anesthesia Assessment:                           - Prior to the procedure, a History and Physical                            was performed, and patient medications and                            allergies were reviewed. The patient's tolerance of                            previous anesthesia was also reviewed. The risks                            and benefits of the procedure and the sedation                            options and risks were discussed with the patient.                            All questions were answered, and informed consent                            was obtained. Prior Anticoagulants: The patient has                            taken no anticoagulant or antiplatelet agents. ASA                            Grade Assessment: I - A normal, healthy patient.                            After reviewing the risks and benefits, the patient  was deemed in satisfactory condition to undergo the                            procedure.                           After obtaining informed consent, the colonoscope                            was passed under direct vision. Throughout the                            procedure, the patient's blood pressure, pulse, and                            oxygen saturations were monitored continuously.  The                            CF [id number] #[id number] was introduced through the anus                            and advanced to the the cecum, identified by                            appendiceal orifice and ileocecal valve. The                            colonoscopy was somewhat difficult due to initially                            fair bowel prep, a redundant colon and significant                            looping. Successful completion of the procedure was                            aided by using manual pressure, straightening and                            shortening the scope to obtain bowel loop reduction                            and lavage. The patient tolerated the procedure                            fairly well. The quality of the bowel preparation                            was good after extensive lavage of opaque liquid.                            The ileocecal valve, appendiceal orifice, and  rectum were photographed. The bowel preparation                            used was SUPREP via split dose instruction. Scope In: 1:30:28 PM Scope Out: 1:56:47 PM Scope Withdrawal Time: 0 hours 20 minutes 2 seconds  Total Procedure Duration: 0 hours 26 minutes 19 seconds  Findings:                 The perianal and digital rectal examinations were                            normal.                           A diminutive polyp was found in the distal                            transverse colon. The polyp was semi-sessile. The                            polyp was removed with a cold snare. Resection and                            retrieval were complete.                           The exam was otherwise without abnormality on                            direct and retroflexion views. Complications:            No immediate complications. Estimated Blood Loss:     Estimated blood loss was minimal. Impression:               - One diminutive polyp in the distal  transverse                            colon, removed with a cold snare. Resected and                            retrieved.                           - The examination was otherwise normal on direct                            and retroflexion views. Recommendation:           - Patient has a contact number available for                            emergencies. The signs and symptoms of potential                            delayed complications were discussed with the  patient. Return to normal activities tomorrow.                            Written discharge instructions were provided to the                            patient.                           - Resume previous diet.                           - Continue present medications.                           - Await pathology results.                           - Repeat colonoscopy in 5 years for surveillance                            (see notes above regarding prep). For next                            colonoscopy: Dulcolax 20 mg before evening dose                            prep, and consume more water with both p.m. and                            a.m. prep doses. Kasson Lamere L. Loletha Carrow, MD 02/24/2022 2:03:23 PM This report has been signed electronically.

## 2022-02-24 NOTE — Progress Notes (Signed)
History and Physical:  This patient presents for endoscopic testing for: Encounter Diagnosis  Name Primary?   Personal history of colonic polyps Yes    TA and SSp x 2 last colonoscopy Sept 2019 Patient denies chronic abdominal pain, rectal bleeding, constipation or diarrhea.   Patient is otherwise without complaints or active issues today.   Past Medical History: Past Medical History:  Diagnosis Date   Bladder tumor 2002   removed by cystoscopy   Cancer ([facility])    Low back pain    PONV (postoperative nausea and vomiting)      Past Surgical History: Past Surgical History:  Procedure Laterality Date   CYSTOSCOPY  02/11/2000   for bladder tumor   INGUINAL HERNIA REPAIR  02/11/2008   MENISCUS REPAIR  02/1999    Allergies: Allergies  Allergen Reactions   Lactose Intolerance (Gi)     unknown    Outpatient Meds: Current Outpatient Medications  Medication Sig Dispense Refill   acetaminophen (TYLENOL) 500 MG tablet Take 500 mg by mouth every 6 (six) hours as needed. (Patient not taking: Reported on 02/13/2022)     ibuprofen (ADVIL,MOTRIN) 200 MG tablet Take 200 mg by mouth as needed. (Patient not taking: Reported on 02/13/2022)     Current Facility-Administered Medications  Medication Dose Route Frequency Provider Last Rate Last Admin   0.9 %  sodium chloride infusion  500 mL Intravenous Once Danis, Estill Cotta III, MD       0.9 %  sodium chloride infusion  500 mL Intravenous Continuous Nelida Meuse III, MD          ___________________________________________________________________ Objective   Exam:  BP 138/84   Pulse 68   Temp (!) 96.9 F (36.1 C) (Temporal)   Resp 13   Ht '5\' 10"'$  (1.778 m)   Wt 158 lb (71.7 kg)   SpO2 98%   BMI 22.67 kg/m   CV: regular , S1/S2 Resp: clear to auscultation bilaterally, normal RR and effort noted GI: soft, no tenderness, with active bowel sounds.   Assessment: Encounter Diagnosis  Name Primary?   Personal history of  colonic polyps Yes     Plan: Colonoscopy  The benefits and risks of the planned procedure were described in detail with the patient or (when appropriate) their health care proxy.  Risks were outlined as including, but not limited to, bleeding, infection, perforation, adverse medication reaction leading to cardiac or pulmonary decompensation, pancreatitis (if ERCP).  The limitation of incomplete mucosal visualization was also discussed.  No guarantees or warranties were given.    The patient is appropriate for an endoscopic procedure in the ambulatory setting.   - Wilfrid Lund, MD

## 2022-02-24 NOTE — Progress Notes (Signed)
Pt resting comfortably. VSS. Airway intact. SBAR complete to RN. All questions answered.   

## 2022-02-24 NOTE — Progress Notes (Signed)
Called to room to assist during endoscopic procedure.  Patient ID and intended procedure confirmed with present staff. Received instructions for my participation in the procedure from the performing physician.

## 2022-02-24 NOTE — Patient Instructions (Signed)
Discharge instructions given. Handout on polyps. Resume previous medications. YOU HAD AN ENDOSCOPIC PROCEDURE TODAY AT [facility]:   Refer to the procedure report that was given to you for any specific questions about what was found during the examination.  If the procedure report does not answer your questions, please call your gastroenterologist to clarify.  If you requested that your care partner not be given the details of your procedure findings, then the procedure report has been included in a sealed envelope for you to review at your convenience later.  YOU SHOULD EXPECT: Some feelings of bloating in the abdomen. Passage of more gas than usual.  Walking can help get rid of the air that was put into your GI tract during the procedure and reduce the bloating. If you had a lower endoscopy (such as a colonoscopy or flexible sigmoidoscopy) you may notice spotting of blood in your stool or on the toilet paper. If you underwent a bowel prep for your procedure, you may not have a normal bowel movement for a few days.  Please Note:  You might notice some irritation and congestion in your nose or some drainage.  This is from the oxygen used during your procedure.  There is no need for concern and it should clear up in a day or so.  SYMPTOMS TO REPORT IMMEDIATELY:  Following lower endoscopy (colonoscopy or flexible sigmoidoscopy):  Excessive amounts of blood in the stool  Significant tenderness or worsening of abdominal pains  Swelling of the abdomen that is new, acute  Fever of 100F or higher   Black, tarry-looking stools  For urgent or emergent issues, a gastroenterologist can be reached at any hour by calling [phone]. Do not use MyChart messaging for urgent concerns.    DIET:  We do recommend a small meal at first, but then you may proceed to your regular diet.  Drink plenty of fluids but you should avoid alcoholic beverages for 24 hours.  ACTIVITY:  You should plan  to take it easy for the rest of today and you should NOT DRIVE or use heavy machinery until tomorrow (because of the sedation medicines used during the test).    FOLLOW UP: Our staff will call the number listed on your records the next business day following your procedure.  We will call around 7:15- 8:00 am to check on you and address any questions or concerns that you may have regarding the information given to you following your procedure. If we do not reach you, we will leave a message.     If any biopsies were taken you will be contacted by phone or by letter within the next 1-3 weeks.  Please call us at [phone] if you have not heard about the biopsies in 3 weeks.    SIGNATURES/CONFIDENTIALITY: You and/or your care partner have signed paperwork which will be entered into your electronic medical record.  These signatures attest to the fact that that the information above on your After Visit Summary has been reviewed and is understood.  Full responsibility of the confidentiality of this discharge information lies with you and/or your care-partner.

## 2022-02-24 NOTE — Progress Notes (Signed)
Pt's states no medical or surgical changes since previsit or office visit. 

## 2022-02-25 ENCOUNTER — Telehealth: Payer: Self-pay

## 2022-02-25 NOTE — Telephone Encounter (Signed)
Left message on follow up call. 

## 2022-03-04 ENCOUNTER — Encounter: Payer: Self-pay | Admitting: Gastroenterology

## 2022-12-19 DIAGNOSIS — Z0189 Encounter for other specified special examinations: Secondary | ICD-10-CM | POA: Diagnosis not present

## 2022-12-19 DIAGNOSIS — Z0000 Encounter for general adult medical examination without abnormal findings: Secondary | ICD-10-CM | POA: Diagnosis not present

## 2022-12-19 DIAGNOSIS — Z1212 Encounter for screening for malignant neoplasm of rectum: Secondary | ICD-10-CM | POA: Diagnosis not present

## 2022-12-25 DIAGNOSIS — Z1212 Encounter for screening for malignant neoplasm of rectum: Secondary | ICD-10-CM | POA: Diagnosis not present
